# Patient Record
Sex: Female | Born: 1975 | Race: White | Hispanic: No | Marital: Single | Smoking: Current every day smoker
Health system: Southern US, Community
[De-identification: ages and names within clinical notes are randomized; demographics above are authoritative.]

## PROBLEM LIST (undated history)

## (undated) DIAGNOSIS — F32A Depression, unspecified: Secondary | ICD-10-CM

## (undated) DIAGNOSIS — M5030 Other cervical disc degeneration, unspecified cervical region: Secondary | ICD-10-CM

## (undated) DIAGNOSIS — F419 Anxiety disorder, unspecified: Secondary | ICD-10-CM

## (undated) HISTORY — PX: PLACEMENT OF BREAST IMPLANTS: SHX6334

## (undated) HISTORY — PX: ABDOMINAL HYSTERECTOMY: SHX81

---

## 1998-10-11 HISTORY — PX: AUGMENTATION MAMMAPLASTY: SUR837

## 2014-10-11 HISTORY — PX: BREAST BIOPSY: SHX20

## 2019-12-14 ENCOUNTER — Other Ambulatory Visit: Payer: Self-pay | Admitting: Obstetrics and Gynecology

## 2019-12-14 DIAGNOSIS — Z1231 Encounter for screening mammogram for malignant neoplasm of breast: Secondary | ICD-10-CM

## 2019-12-17 ENCOUNTER — Other Ambulatory Visit: Payer: Self-pay

## 2019-12-17 ENCOUNTER — Ambulatory Visit
Admission: RE | Admit: 2019-12-17 | Discharge: 2019-12-17 | Disposition: A | Discharge status: Home / Self Care | Payer: 59 | Source: Ambulatory Visit | Attending: Obstetrics and Gynecology | Admitting: Obstetrics and Gynecology

## 2019-12-17 ENCOUNTER — Other Ambulatory Visit: Payer: Self-pay | Admitting: Obstetrics and Gynecology

## 2019-12-17 DIAGNOSIS — Z1231 Encounter for screening mammogram for malignant neoplasm of breast: Secondary | ICD-10-CM | POA: Diagnosis not present

## 2019-12-17 IMAGING — MG DIGITAL SCREENING BREAST BILAT IMPLANT W/ TOMO W/ CAD
8 of 19 series · 8 of 40 positions shown · non-contrast
Comparison: Previous exam(s).

CLINICAL DATA: Screening.

EXAM:
DIGITAL SCREENING BILATERAL MAMMOGRAM WITH IMPLANTS, CAD AND TOMO
The patient has retropectoral implants. Standard and implant
displaced views were performed.

[L CC]
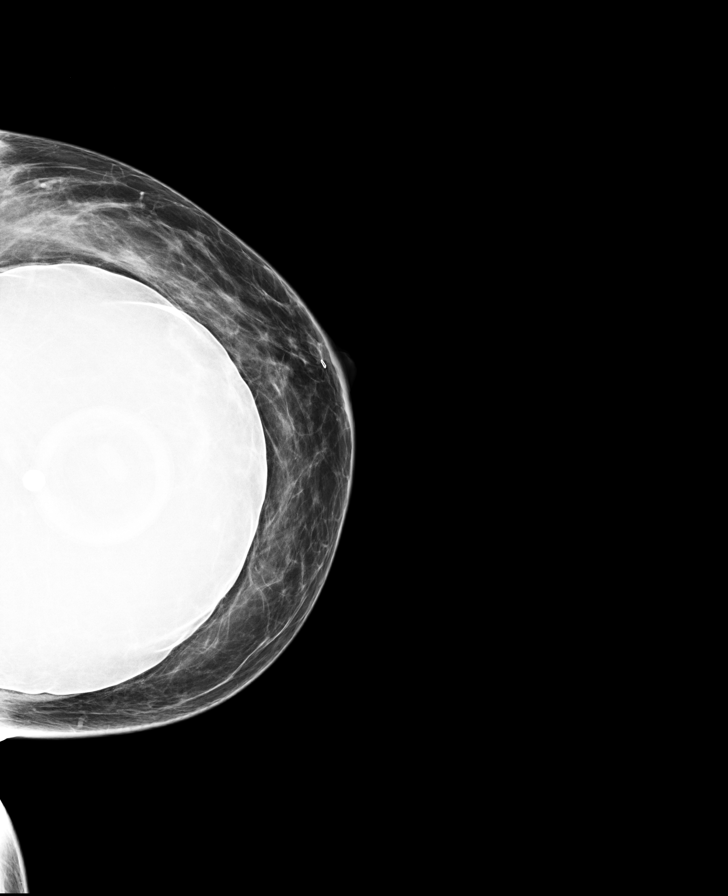

[R CC (1 of 2)]
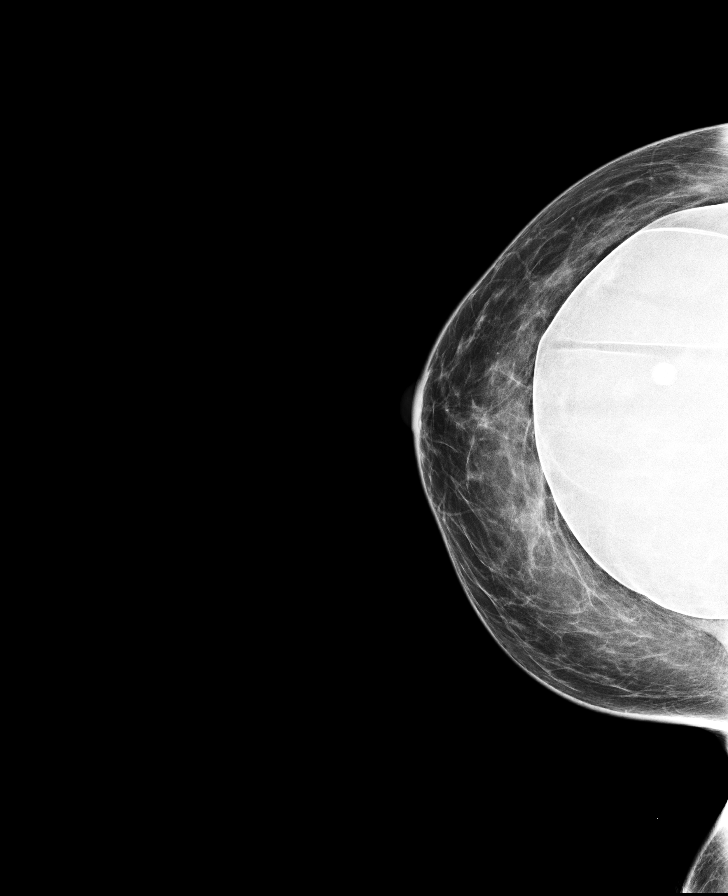

[R MLO (1 of 2)]
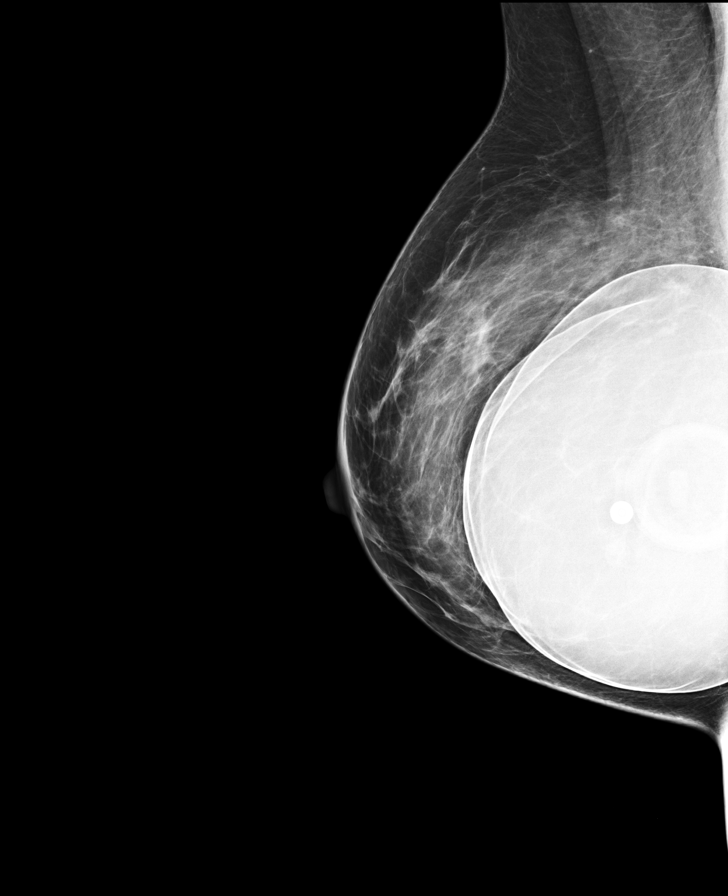

[L MLO]
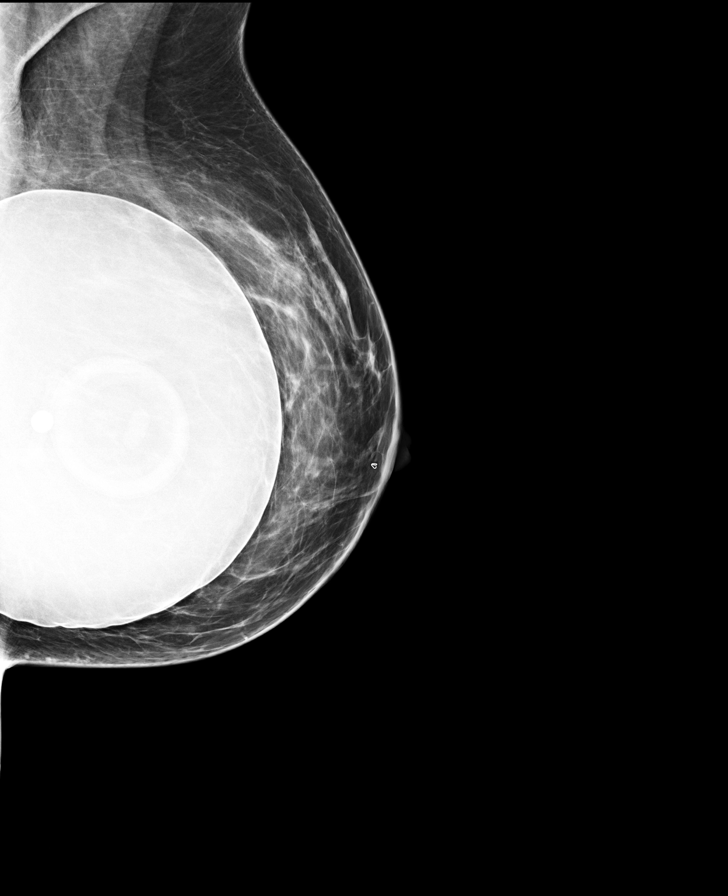

[L CC synth-2D]
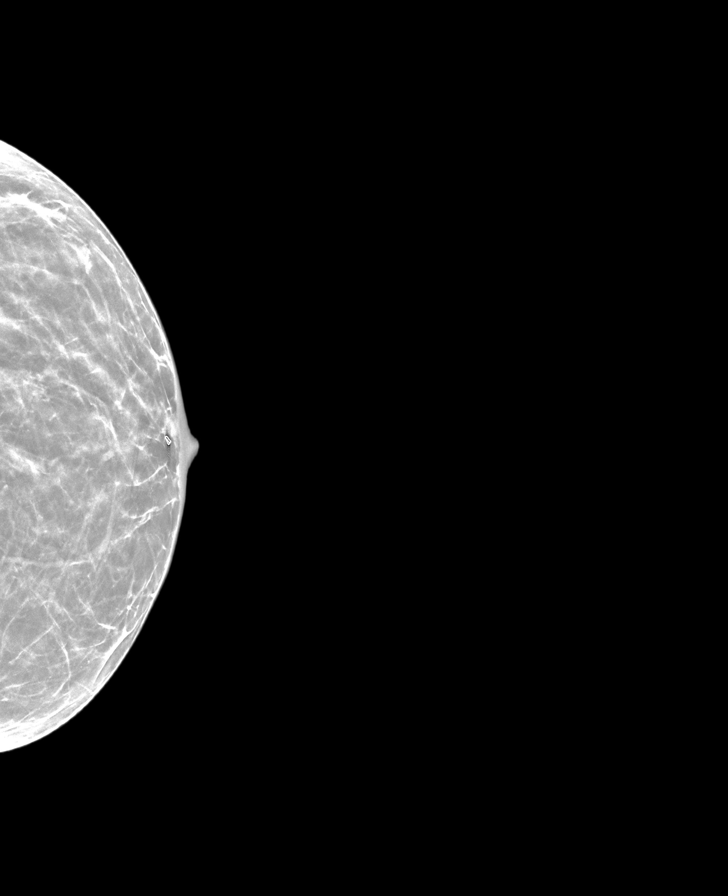

[R MLO (2 of 2)]
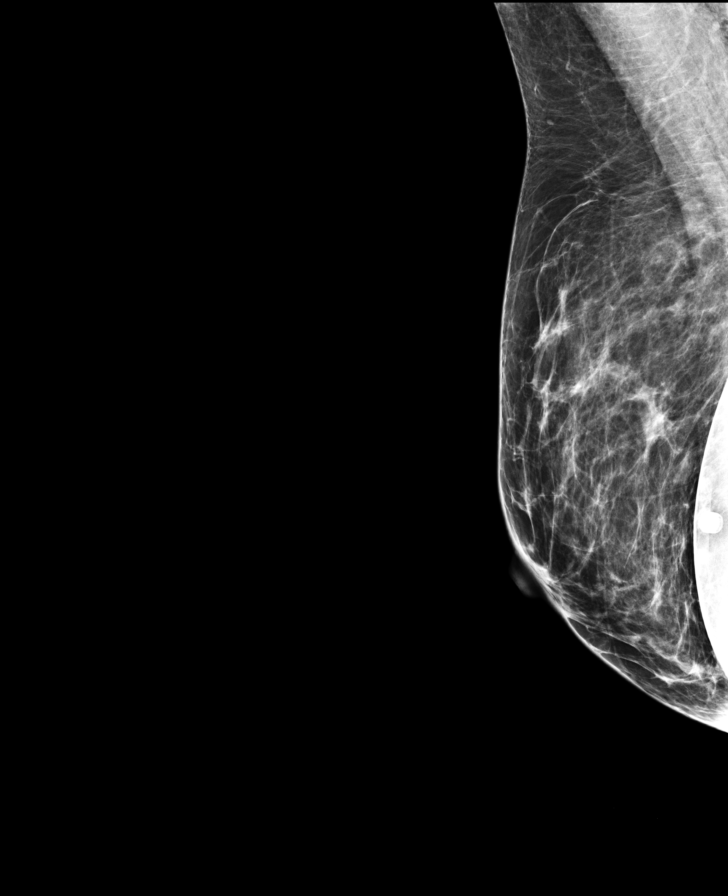

[R CC (2 of 2)]
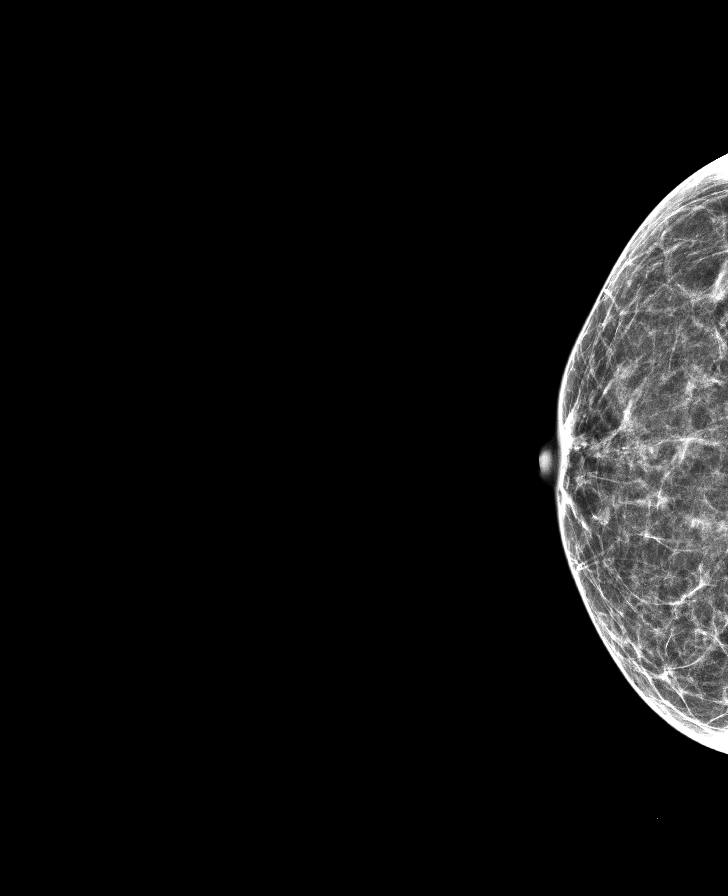

[L MLO synth-2D]
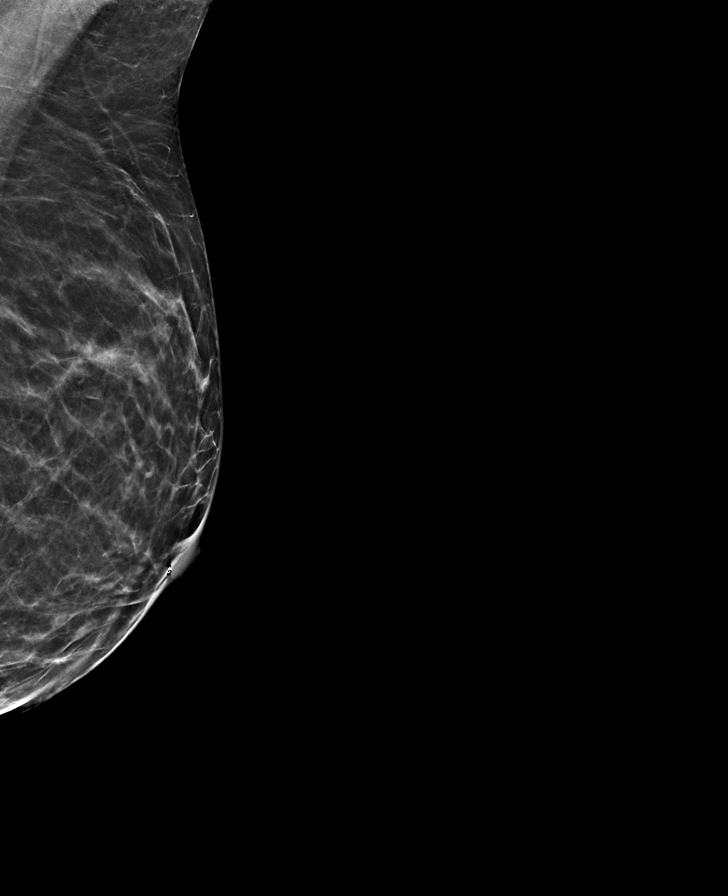

[8 of 40 positions shown; findings below may reference images not displayed]

ACR Breast Density Category b: There are scattered areas of
fibroglandular density.
FINDINGS: There are no findings suspicious for malignancy. Images were
processed with CAD.
IMPRESSION: No mammographic evidence of malignancy. A result letter of this
screening mammogram will be mailed directly to the patient.

RECOMMENDATION:
Screening mammogram in one year. (Code:[6A])

BI-RADS CATEGORY  1:  Negative.

## 2019-12-20 ENCOUNTER — Inpatient Hospital Stay
Admission: RE | Admit: 2019-12-20 | Discharge: 2019-12-20 | Disposition: A | Discharge status: Home / Self Care | Payer: Self-pay | Source: Ambulatory Visit | Attending: *Deleted | Admitting: *Deleted

## 2019-12-20 ENCOUNTER — Other Ambulatory Visit: Payer: Self-pay | Admitting: *Deleted

## 2019-12-20 DIAGNOSIS — Z1231 Encounter for screening mammogram for malignant neoplasm of breast: Secondary | ICD-10-CM

## 2020-05-16 ENCOUNTER — Other Ambulatory Visit: Payer: Self-pay | Admitting: Obstetrics and Gynecology

## 2020-05-16 NOTE — H&P (Signed)
Janet Warren is a 44 y.o. female here for [facility] and bilateral salpingectomy .   Marland Kitchenpt witha history of menorrhagia . Heavy 5 days at a time which is preventing her from social activities . May bleed more than once per month . LAb work c/w menopause . EMBX shows proliferative findings Intermittent dyspareunia( rare). Lysteda has helped some   Pap : neg   EMBX : proliferative  U/s :  Fibroid ut 1 rt lat= 17 mm 2 rt ant= 18 mm  Endometrium=9.05 mm  Lt ov wnl  Rt simple ov cyst= 1.41 cm Past Medical History:  has a past medical history of Abnormal Pap smear, Adjustment disorder with anxiety (06/30/2011), Allergic state, Anxiety, Candidiasis of vulva and vagina (05/28/2009), Cervical high risk human papillomavirus (HPV) DNA test positive (08/24/2011), Cigarette smoker, Depression, and Metrorrhagia (04/17/2009).  Past Surgical History:  has a past surgical history that includes Cesarean section (1994). Family History: family history includes Cataracts in her mother; Heart disease (age of onset: 52) in her father; High blood pressure (Hypertension) in her mother; High blood pressure (Hypertension) (age of onset: 26) in her father; Hyperlipidemia (Elevated cholesterol) (age of onset: 63) in her father. Social History:  reports that she has been smoking cigarettes. She has a 25.00 pack-year smoking history. She has never used smokeless tobacco. She reports current alcohol use of about 6.0 standard drinks of alcohol per week. She reports that she does not use drugs. OB/GYN History:          OB History    Gravida  1   Para  1   Term  1   Preterm  0   AB  0   Living  1     SAB  0   TAB  0   Ectopic  0   Molar      Multiple  0   Live Births  1          Allergies: has No Known Allergies. Medications:  Current Outpatient Medications:  .  buPROPion (WELLBUTRIN XL) 150 MG XL tablet, Take 1 tablet (150 mg total) by mouth once daily for 180 days, Disp: 90 tablet, Rfl:  3 .  cyanocobalamin (VITAMIN B12) 1000 MCG tablet, Take 1,000 mcg by mouth daily., Disp: , Rfl:  .  gabapentin (NEURONTIN) 600 MG tablet, Take 1 tablet (600 mg total) by mouth 3 (three) times daily, Disp: 270 tablet, Rfl: 3 .  MULTIVITAMIN ORAL, Take 1 tablet by mouth once daily.  , Disp: , Rfl:  .  tiZANidine (ZANAFLEX) 2 MG tablet, TAKE 1 TABLET (2 MG TOTAL) BY MOUTH NIGHTLY AS NEEDED, Disp: 30 tablet, Rfl: 1 .  tranexamic acid (LYSTEDA) 650 mg tablet, Take 2 tablets (1,300 mg total) by mouth 3 (three) times daily Take for a maximum of 5 days during monthly menstruation., Disp: 30 tablet, Rfl: 3 .  albuterol 90 mcg/actuation inhaler, Inhale 2 inhalations into the lungs every 6 (six) hours as needed for up to 30 days, Disp: 1 Inhaler, Rfl: 0 .  sertraline (ZOLOFT) 100 MG tablet, Take 1.5 tablets (150 mg total) by mouth once daily for 90 days (Patient not taking: Reported on 03/15/2019  ), Disp: 135 tablet, Rfl: 1 .  tranexamic acid (LYSTEDA) 650 mg tablet, Take 2 tablets (1,300 mg total) by mouth 3 (three) times daily Take for a maximum of 5 days during monthly menstruation. (Patient not taking: Reported on 05/15/2020  ), Disp: 90 tablet, Rfl: 0  Review of Systems:  General:                      No fatigue or weight loss Eyes:                           No vision changes Ears:                            No hearing difficulty Respiratory:                No cough or shortness of breath Pulmonary:                  No asthma or shortness of breath Cardiovascular:           No chest pain, palpitations, dyspnea on exertion Gastrointestinal:          No abdominal bloating, chronic diarrhea, constipations, masses, pain or hematochezia Genitourinary:             No hematuria, dysuria, abnormal vaginal discharge, pelvic pain, Menometrorrhagia Lymphatic:                   No swollen lymph nodes Musculoskeletal:         No muscle weakness Neurologic:                  No extremity weakness, syncope, seizure  disorder Psychiatric:                  No history of depression, delusions or suicidal/homicidal ideation    Exam:      Vitals:   05/15/20 1419  BP: 136/80  Pulse:     Body mass index is 26.29 kg/m.  WDWN white/  female in NAD   Lungs: CTA  CV : RRR without murmur    Neck:  no thyromegaly Abdomen: soft , no mass, normal active bowel sounds,  non-tender, no rebound tenderness Pelvic: tanner stage 5 ,  External genitalia: vulva /labia no lesions Urethra: no prolapse Vagina: normal physiologic d/c Cervix: no lesions, no cervical motion tenderness. Uterus:10 weeks slight irregularity Adnexa:no mass, non-tender   Impression:   The encounter diagnosis was Menorrhagia with irregular cycle.    Plan:   Benefits and risks to surgery: LSH and bilateral salpingectomy  The proposed benefit of the surgery has been discussed with the patient. The possible risks include, but are not limited to: organ injury to the bowel , bladder, ureters, and major blood vessels and nerves. There is a possibility of additional surgeries resulting from these injuries. There is also the risk of blood transfusion and the need to receive blood products during or after the procedure which may rarely lead to HIV or Hepatitis C infection. There is a risk of developing a deep venous thrombosis or a pulmonary embolism . There is the possibility of wound infection and also anesthetic complications, even the rare possibility of death. The patient understands these risks and wishes to proceed. All questions have been answered and the consent has been signed. She is aware of the risk of occult cancer and upstaging cancer. Risk between 1:770-1: 10,000    .  Caroline Sauger, MD

## 2020-05-20 ENCOUNTER — Other Ambulatory Visit: Payer: Self-pay

## 2020-05-20 ENCOUNTER — Encounter
Admission: RE | Admit: 2020-05-20 | Discharge: 2020-05-20 | Disposition: A | Discharge status: Home / Self Care | Payer: 59 | Source: Ambulatory Visit | Attending: Obstetrics and Gynecology | Admitting: Obstetrics and Gynecology

## 2020-05-20 HISTORY — DX: Anxiety disorder, unspecified: F41.9

## 2020-05-20 HISTORY — DX: Other cervical disc degeneration, unspecified cervical region: M50.30

## 2020-05-20 HISTORY — DX: Depression, unspecified: F32.A

## 2020-05-20 NOTE — Patient Instructions (Signed)
Your procedure is scheduled on: 05/22/20 Report to [place]. To find out your arrival time please call [phone] between 1PM - 3PM on 05/21/20.  Remember: Instructions that are not followed completely may result in serious medical risk, up to and including death, or upon the discretion of your surgeon and anesthesiologist your surgery may need to be rescheduled.     _X__ 1. Do not eat food after midnight the night before your procedure.                 No gum chewing or hard candies. You may drink clear liquids up to 2 hours                 before you are scheduled to arrive for your surgery- DO not drink clear                 liquids within 2 hours of the start of your surgery.                 Clear Liquids include:  water, apple juice without pulp, clear carbohydrate                 drink such as Clearfast or Gatorade, Black Coffee or Tea (Do not add                 anything to coffee or tea). Diabetics water only  __X__2.  On the morning of surgery brush your teeth with toothpaste and water, you                 may rinse your mouth with mouthwash if you wish.  Do not swallow any              toothpaste of mouthwash.     _X__ 3.  No Alcohol for 24 hours before or after surgery.   _X__ 4.  Do Not Smoke or use e-cigarettes For 24 Hours Prior to Your Surgery.                 Do not use any chewable tobacco products for at least 6 hours prior to                 surgery.  ____  5.  Bring all medications with you on the day of surgery if instructed.   __X__  6.  Notify your doctor if there is any change in your medical condition      (cold, fever, infections).     Do not wear jewelry, make-up, hairpins, clips or nail polish. Do not wear lotions, powders, or perfumes.  Do not shave 48 hours prior to surgery. Men may shave face and neck. Do not bring valuables to the hospital.    [facility] is not responsible for any belongings or  valuables.  Contacts, dentures/partials or body piercings may not be worn into surgery. Bring a case for your contacts, glasses or hearing aids, a denture cup will be supplied. Leave your suitcase in the car. After surgery it may be brought to your room. For patients admitted to the hospital, discharge time is determined by your treatment team.   Patients discharged the day of surgery will not be allowed to drive home.   Please read over the following fact sheets that you were given:   MRSA Information  __X__ Take these medicines the morning of surgery with A SIP OF WATER:  1. buPROPion (WELLBUTRIN XL) 150 MG 24 hr tablet  2. gabapentin (NEURONTIN) 600 MG tablet  3.   4.  5.  6.  ____ Fleet Enema (as directed)   __X__ Use CHG Soap/SAGE wipes as directed  ____ Use inhalers on the day of surgery  ____ Stop metformin/Janumet/Farxiga 2 days prior to surgery    ____ Take 1/2 of usual insulin dose the night before surgery. No insulin the morning          of surgery.   ____ Stop Blood Thinners Coumadin/Plavix/Xarelto/Pleta/Pradaxa/Eliquis/Effient/Aspirin  on   Or contact your Surgeon, Cardiologist or Medical Doctor regarding  ability to stop your blood thinners  __X__ Stop Anti-inflammatories 7 days before surgery such as Advil, Ibuprofen, Motrin,  BC or Goodies Powder, Naprosyn, Naproxen, Aleve, Aspirin    __X__ Stop all herbal supplements, fish oil or vitamin E until after surgery.    ____ Bring C-Pap to the hospital.     How to Use an Incentive Spirometer An incentive spirometer is a tool that measures how well you are filling your lungs with each breath. Learning to take long, deep breaths using this tool can help you keep your lungs clear and active. This may help to reverse or lessen your chance of developing breathing (pulmonary) problems, especially infection. You may be asked to use a spirometer:  After a surgery.  If you have a lung problem or a history of  smoking.  After a long period of time when you have been unable to move or be active. If the spirometer includes an indicator to show the highest number that you have reached, your health care provider or respiratory therapist will help you set a goal. Keep a list (log) of your progress as told by your health care provider. What are the risks?  Breathing too quickly may cause dizziness or cause you to pass out. Take your time so you do not get dizzy or light-headed.  If you are in pain, you may need to take pain medicine before doing incentive spirometry. It is harder to take a deep breath if you are having pain. How to use your incentive spirometer  1. Sit up on the edge of your bed or on a chair. 2. Hold the incentive spirometer so that it is in an upright position. 3. Before you use the spirometer, breathe out normally. 4. Place the mouthpiece in your mouth. Make sure your lips are closed tightly around it. 5. Breathe in slowly and as deeply as you can through your mouth, causing the piston or the ball to rise toward the top of the chamber. 6. Hold your breath for 3-5 seconds, or for as long as possible. ? If the spirometer includes a coach indicator, use this to guide you in breathing. Slow down your breathing if the indicator goes above the marked areas. 7. Remove the mouthpiece from your mouth and breathe out normally. The piston or ball will return to the bottom of the chamber. 8. Rest for a few seconds, then repeat the steps 10 or more times. ? Take your time and take a few normal breaths between deep breaths so that you do not get dizzy or light-headed. ? Do this every 1-2 hours when you are awake. 9. If the spirometer includes a goal marker to show the highest number you have reached (best effort), use this as a goal to work toward during each repetition. 10. After each set of 10 deep breaths, cough a few times.  This will help to make sure that your lungs are clear. ? If you have an  incision on your chest or abdomen from surgery, place a pillow or a rolled-up towel firmly against the incision when you cough. This can help to reduce pain from coughing. General tips  When you become able to get out of bed, walk around often and continue to cough to help clear your lungs.  Keep using the incentive spirometer until your health care provider says it is okay to stop using it. If you have been in the hospital, you may be told to keep using the spirometer at home. Contact a health care provider if:  You are having difficulty using the spirometer.  You have trouble using the spirometer as often as instructed.  Your pain medicine is not giving enough relief for you to use the spirometer as told.  You have a fever.  You develop shortness of breath. Get help right away if:  You develop a cough with bloody mucus from the lungs (bloody sputum).  You have fluid or blood coming from an incision site after you cough. Summary  An incentive spirometer is a tool that can help you learn to take long, deep breaths to keep your lungs clear and active.  You may be asked to use a spirometer after a surgery, if you have a lung problem or a history of smoking, or if you have been inactive for a long period of time.  Use your incentive spirometer as instructed every 1-2 hours while you are awake.  If you have an incision on your chest or abdomen, place a pillow or a rolled-up towel firmly against your incision when you cough. This will help to reduce pain. This information is not intended to replace advice given to you by your health care provider. Make sure you discuss any questions you have with your health care provider. Document Revised: 04/27/2019 Document Reviewed: 08/10/2017 Elsevier Patient Education  2020 [employer].

## 2020-05-21 ENCOUNTER — Other Ambulatory Visit
Admission: RE | Admit: 2020-05-21 | Discharge: 2020-05-21 | Disposition: A | Discharge status: Home / Self Care | Payer: 59 | Source: Ambulatory Visit | Attending: Obstetrics and Gynecology | Admitting: Obstetrics and Gynecology

## 2020-05-21 DIAGNOSIS — Z20822 Contact with and (suspected) exposure to covid-19: Secondary | ICD-10-CM | POA: Insufficient documentation

## 2020-05-21 DIAGNOSIS — Z01812 Encounter for preprocedural laboratory examination: Secondary | ICD-10-CM | POA: Insufficient documentation

## 2020-05-21 LAB — CBC
HCT: 44.3 % (ref 36.0–46.0)
Hemoglobin: 14.9 g/dL (ref 12.0–15.0)
MCH: 32.4 pg (ref 26.0–34.0)
MCHC: 33.6 g/dL (ref 30.0–36.0)
MCV: 96.3 fL (ref 80.0–100.0)
Platelets: 257 10*3/uL (ref 150–400)
RBC: 4.6 MIL/uL (ref 3.87–5.11)
RDW: 12.5 % (ref 11.5–15.5)
WBC: 5.8 10*3/uL (ref 4.0–10.5)
nRBC: 0 % (ref 0.0–0.2)

## 2020-05-21 LAB — BASIC METABOLIC PANEL
Anion gap: 8 (ref 5–15)
BUN: 8 mg/dL (ref 6–20)
CO2: 29 mmol/L (ref 22–32)
Calcium: 8.8 mg/dL — ABNORMAL LOW (ref 8.9–10.3)
Chloride: 104 mmol/L (ref 98–111)
Creatinine, Ser: 0.9 mg/dL (ref 0.44–1.00)
GFR calc Af Amer: >60 mL/min (ref >60)
GFR calc non Af Amer: >60 mL/min (ref >60)
Glucose, Bld: 63 mg/dL — ABNORMAL LOW (ref 70–99)
Potassium: 3.8 mmol/L (ref 3.5–5.1)
Sodium: 141 mmol/L (ref 135–145)

## 2020-05-21 LAB — TYPE AND SCREEN
ABO/RH(D): B POS
Antibody Screen: NEGATIVE

## 2020-05-21 LAB — SARS CORONAVIRUS 2 (TAT 6-24 HRS): SARS Coronavirus 2: NEGATIVE

## 2020-05-22 ENCOUNTER — Ambulatory Visit
Admission: RE | Admit: 2020-05-22 | Discharge: 2020-05-22 | Disposition: A | Discharge status: Home / Self Care | Payer: 59 | Attending: Obstetrics and Gynecology | Admitting: Obstetrics and Gynecology

## 2020-05-22 ENCOUNTER — Encounter
Admission: RE | Disposition: A | Discharge status: Home / Self Care | Payer: Self-pay | Source: Home / Self Care | Attending: Obstetrics and Gynecology

## 2020-05-22 ENCOUNTER — Ambulatory Visit: Payer: 59 | Admitting: Anesthesiology

## 2020-05-22 ENCOUNTER — Encounter: Payer: Self-pay | Admitting: Obstetrics and Gynecology

## 2020-05-22 DIAGNOSIS — Z79899 Other long term (current) drug therapy: Secondary | ICD-10-CM | POA: Diagnosis not present

## 2020-05-22 DIAGNOSIS — N83201 Unspecified ovarian cyst, right side: Secondary | ICD-10-CM | POA: Insufficient documentation

## 2020-05-22 DIAGNOSIS — N921 Excessive and frequent menstruation with irregular cycle: Secondary | ICD-10-CM | POA: Diagnosis present

## 2020-05-22 DIAGNOSIS — N803 Endometriosis of pelvic peritoneum: Secondary | ICD-10-CM | POA: Diagnosis not present

## 2020-05-22 DIAGNOSIS — N800 Endometriosis of uterus: Secondary | ICD-10-CM | POA: Insufficient documentation

## 2020-05-22 DIAGNOSIS — N838 Other noninflammatory disorders of ovary, fallopian tube and broad ligament: Secondary | ICD-10-CM | POA: Diagnosis not present

## 2020-05-22 DIAGNOSIS — D282 Benign neoplasm of uterine tubes and ligaments: Secondary | ICD-10-CM | POA: Diagnosis not present

## 2020-05-22 DIAGNOSIS — F4323 Adjustment disorder with mixed anxiety and depressed mood: Secondary | ICD-10-CM | POA: Diagnosis not present

## 2020-05-22 DIAGNOSIS — N809 Endometriosis, unspecified: Secondary | ICD-10-CM

## 2020-05-22 DIAGNOSIS — F17210 Nicotine dependence, cigarettes, uncomplicated: Secondary | ICD-10-CM | POA: Diagnosis not present

## 2020-05-22 HISTORY — PX: LAPAROSCOPIC SUPRACERVICAL HYSTERECTOMY: SHX5399

## 2020-05-22 HISTORY — PX: LAPAROSCOPIC BILATERAL SALPINGECTOMY: SHX5889

## 2020-05-22 LAB — POCT PREGNANCY, URINE: Preg Test, Ur: NEGATIVE

## 2020-05-22 LAB — ABO/RH: ABO/RH(D): B POS

## 2020-05-22 SURGERY — HYSTERECTOMY, SUPRACERVICAL, LAPAROSCOPIC
Anesthesia: General

## 2020-05-22 MED ORDER — OXYCODONE-ACETAMINOPHEN 5-325 MG PO TABS: 1.0000 | ORAL_TABLET | Freq: Four times a day (QID) | ORAL | Status: DC | PRN

## 2020-05-22 MED ORDER — LACTATED RINGERS IV SOLN: INTRAVENOUS | Status: DC

## 2020-05-22 MED ORDER — ACETAMINOPHEN 500 MG PO TABS
ORAL_TABLET | ORAL | Status: AC
  Administered 2020-05-22: 1000 mg via ORAL
  Filled 2020-05-22: qty 2

## 2020-05-22 MED ORDER — MIDAZOLAM HCL 2 MG/2ML IJ SOLN
INTRAMUSCULAR | Status: AC
  Filled 2020-05-22: qty 2

## 2020-05-22 MED ORDER — HYDROMORPHONE HCL 1 MG/ML IJ SOLN
INTRAMUSCULAR | Status: AC
  Filled 2020-05-22: qty 1

## 2020-05-22 MED ORDER — FAMOTIDINE 20 MG PO TABS
ORAL_TABLET | ORAL | Status: AC
  Filled 2020-05-22: qty 1

## 2020-05-22 MED ORDER — KETOROLAC TROMETHAMINE 30 MG/ML IJ SOLN: 30.0000 mg | Freq: Once | INTRAMUSCULAR | Status: DC | PRN

## 2020-05-22 MED ORDER — ACETAMINOPHEN 325 MG PO TABS: 325.0000 mg | ORAL_TABLET | ORAL | Status: DC | PRN

## 2020-05-22 MED ORDER — PHENYLEPHRINE HCL (PRESSORS) 10 MG/ML IV SOLN
INTRAVENOUS | Status: DC | PRN
  Administered 2020-05-22 (×2): 100 ug via INTRAVENOUS

## 2020-05-22 MED ORDER — GABAPENTIN 300 MG PO CAPS
ORAL_CAPSULE | ORAL | Status: AC
  Administered 2020-05-22: 300 mg via ORAL
  Filled 2020-05-22: qty 1

## 2020-05-22 MED ORDER — EPHEDRINE SULFATE 50 MG/ML IJ SOLN
INTRAMUSCULAR | Status: DC | PRN
  Administered 2020-05-22: 5 mg via INTRAVENOUS

## 2020-05-22 MED ORDER — PROPOFOL 10 MG/ML IV BOLUS
INTRAVENOUS | Status: AC
  Filled 2020-05-22: qty 20

## 2020-05-22 MED ORDER — FENTANYL CITRATE (PF) 100 MCG/2ML IJ SOLN
INTRAMUSCULAR | Status: AC
  Filled 2020-05-22: qty 2

## 2020-05-22 MED ORDER — LABETALOL HCL 5 MG/ML IV SOLN
INTRAVENOUS | Status: AC
  Filled 2020-05-22: qty 4

## 2020-05-22 MED ORDER — ORAL CARE MOUTH RINSE: 15.0000 mL | Freq: Once | OROMUCOSAL | Status: AC

## 2020-05-22 MED ORDER — BUPIVACAINE HCL 0.5 % IJ SOLN
INTRAMUSCULAR | Status: DC | PRN
  Administered 2020-05-22: 12 mL

## 2020-05-22 MED ORDER — LIDOCAINE HCL (CARDIAC) PF 100 MG/5ML IV SOSY
PREFILLED_SYRINGE | INTRAVENOUS | Status: DC | PRN
  Administered 2020-05-22: 100 mg via INTRAVENOUS

## 2020-05-22 MED ORDER — DEXAMETHASONE SODIUM PHOSPHATE 10 MG/ML IJ SOLN
INTRAMUSCULAR | Status: DC | PRN
  Administered 2020-05-22: 10 mg via INTRAVENOUS

## 2020-05-22 MED ORDER — PROMETHAZINE HCL 25 MG/ML IJ SOLN: 6.2500 mg | INTRAMUSCULAR | Status: DC | PRN

## 2020-05-22 MED ORDER — ONDANSETRON HCL 4 MG/2ML IJ SOLN
INTRAMUSCULAR | Status: DC | PRN
  Administered 2020-05-22: 4 mg via INTRAVENOUS

## 2020-05-22 MED ORDER — FENTANYL CITRATE (PF) 100 MCG/2ML IJ SOLN: 25.0000 ug | INTRAMUSCULAR | Status: DC | PRN

## 2020-05-22 MED ORDER — CHLORHEXIDINE GLUCONATE 0.12 % MT SOLN: 15.0000 mL | Freq: Once | OROMUCOSAL | Status: AC

## 2020-05-22 MED ORDER — ONDANSETRON HCL 4 MG PO TABS: 8.0000 mg | ORAL_TABLET | Freq: Three times a day (TID) | ORAL | Status: DC | PRN

## 2020-05-22 MED ORDER — ROCURONIUM BROMIDE 100 MG/10ML IV SOLN
INTRAVENOUS | Status: DC | PRN
  Administered 2020-05-22: 50 mg via INTRAVENOUS
  Administered 2020-05-22: 10 mg via INTRAVENOUS

## 2020-05-22 MED ORDER — CEFAZOLIN SODIUM-DEXTROSE 2-4 GM/100ML-% IV SOLN
INTRAVENOUS | Status: AC
  Filled 2020-05-22: qty 100

## 2020-05-22 MED ORDER — MEPERIDINE HCL 50 MG/ML IJ SOLN: 6.2500 mg | INTRAMUSCULAR | Status: DC | PRN

## 2020-05-22 MED ORDER — PROMETHAZINE HCL 25 MG/ML IJ SOLN
INTRAMUSCULAR | Status: AC
  Administered 2020-05-22: 6.25 mg via INTRAVENOUS
  Filled 2020-05-22: qty 1

## 2020-05-22 MED ORDER — LABETALOL HCL 5 MG/ML IV SOLN
INTRAVENOUS | Status: DC | PRN
Start: 2020-05-22 — End: 2020-05-22
  Administered 2020-05-22: 5 mg via INTRAVENOUS

## 2020-05-22 MED ORDER — CHLORHEXIDINE GLUCONATE 0.12 % MT SOLN
OROMUCOSAL | Status: AC
  Administered 2020-05-22: 15 mL via OROMUCOSAL
  Filled 2020-05-22: qty 15

## 2020-05-22 MED ORDER — CEFAZOLIN SODIUM-DEXTROSE 2-4 GM/100ML-% IV SOLN
2.0000 g | Freq: Once | INTRAVENOUS | Status: AC
  Administered 2020-05-22: 2 g via INTRAVENOUS

## 2020-05-22 MED ORDER — BUPIVACAINE HCL (PF) 0.5 % IJ SOLN
INTRAMUSCULAR | Status: AC
  Filled 2020-05-22: qty 30

## 2020-05-22 MED ORDER — POVIDONE-IODINE 10 % EX SWAB
2.0000 "application " | Freq: Once | CUTANEOUS | Status: AC
  Administered 2020-05-22: 2 via TOPICAL

## 2020-05-22 MED ORDER — FAMOTIDINE 20 MG PO TABS
20.0000 mg | ORAL_TABLET | Freq: Once | ORAL | Status: AC
  Administered 2020-05-22: 20 mg via ORAL

## 2020-05-22 MED ORDER — HYDROCODONE-ACETAMINOPHEN 7.5-325 MG PO TABS: 1.0000 | ORAL_TABLET | Freq: Once | ORAL | Status: DC | PRN

## 2020-05-22 MED ORDER — EPHEDRINE 5 MG/ML INJ
INTRAVENOUS | Status: AC
  Filled 2020-05-22: qty 10

## 2020-05-22 MED ORDER — SUGAMMADEX SODIUM 200 MG/2ML IV SOLN
INTRAVENOUS | Status: DC | PRN
  Administered 2020-05-22: 200 mg via INTRAVENOUS

## 2020-05-22 MED ORDER — DROPERIDOL 2.5 MG/ML IJ SOLN
0.6250 mg | Freq: Once | INTRAMUSCULAR | Status: DC | PRN
  Filled 2020-05-22: qty 2

## 2020-05-22 MED ORDER — ACETAMINOPHEN 160 MG/5ML PO SOLN
325.0000 mg | ORAL | Status: DC | PRN
  Filled 2020-05-22: qty 20.3

## 2020-05-22 MED ORDER — PROPOFOL 10 MG/ML IV BOLUS
INTRAVENOUS | Status: DC | PRN
  Administered 2020-05-22: 200 mg via INTRAVENOUS

## 2020-05-22 MED ORDER — KETOROLAC TROMETHAMINE 30 MG/ML IJ SOLN
INTRAMUSCULAR | Status: DC | PRN
  Administered 2020-05-22: 30 mg via INTRAVENOUS

## 2020-05-22 MED ORDER — FENTANYL CITRATE (PF) 100 MCG/2ML IJ SOLN
INTRAMUSCULAR | Status: DC | PRN
  Administered 2020-05-22: 100 ug via INTRAVENOUS
  Administered 2020-05-22: 50 ug via INTRAVENOUS
  Administered 2020-05-22 (×2): 100 ug via INTRAVENOUS

## 2020-05-22 MED ORDER — HYDROMORPHONE HCL 1 MG/ML IJ SOLN
INTRAMUSCULAR | Status: DC | PRN
  Administered 2020-05-22 (×2): .5 mg via INTRAVENOUS

## 2020-05-22 MED ORDER — GABAPENTIN 300 MG PO CAPS: 300.0000 mg | ORAL_CAPSULE | ORAL | Status: AC

## 2020-05-22 MED ORDER — ACETAMINOPHEN 500 MG PO TABS: 1000.0000 mg | ORAL_TABLET | ORAL | Status: AC

## 2020-05-22 MED ORDER — FENTANYL CITRATE (PF) 250 MCG/5ML IJ SOLN
INTRAMUSCULAR | Status: AC
  Filled 2020-05-22: qty 5

## 2020-05-22 MED ORDER — MIDAZOLAM HCL 2 MG/2ML IJ SOLN
INTRAMUSCULAR | Status: DC | PRN
  Administered 2020-05-22: 2 mg via INTRAVENOUS

## 2020-05-22 SURGICAL SUPPLY — 53 items
APPLICATOR COTTON TIP 6 STRL (MISCELLANEOUS) ×2 IMPLANT
APPLICATOR COTTON TIP 6IN STRL (MISCELLANEOUS) ×4
BAG URINE DRAIN 2000ML AR STRL (UROLOGICAL SUPPLIES) ×4 IMPLANT
BLADE SURG SZ11 CARB STEEL (BLADE) ×4 IMPLANT
CANISTER SUCT 1200ML W/VALVE (MISCELLANEOUS) ×4 IMPLANT
CATH FOLEY 2WAY  5CC 16FR (CATHETERS) ×2
CATH ROBINSON RED A/P 16FR (CATHETERS) IMPLANT
CATH URTH 16FR FL 2W BLN LF (CATHETERS) ×2 IMPLANT
CHLORAPREP W/TINT 26 (MISCELLANEOUS) ×4 IMPLANT
CLOSURE WOUND 1/2 X4 (GAUZE/BANDAGES/DRESSINGS) ×1
CLOSURE WOUND 1/4X4 (GAUZE/BANDAGES/DRESSINGS) ×1
COVER WAND RF STERILE (DRAPES) ×4 IMPLANT
DRSG TEGADERM 2-3/8X2-3/4 SM (GAUZE/BANDAGES/DRESSINGS) ×12 IMPLANT
GLOVE SURG SYN 8.0 (GLOVE) ×4 IMPLANT
GOWN STRL REUS W/ TWL LRG LVL3 (GOWN DISPOSABLE) ×4 IMPLANT
GOWN STRL REUS W/ TWL XL LVL3 (GOWN DISPOSABLE) ×2 IMPLANT
GOWN STRL REUS W/TWL LRG LVL3 (GOWN DISPOSABLE) ×4
GOWN STRL REUS W/TWL XL LVL3 (GOWN DISPOSABLE) ×2
GRASPER SUT TROCAR 14GX15 (MISCELLANEOUS) ×4 IMPLANT
IRRIGATION STRYKERFLOW (MISCELLANEOUS) ×2 IMPLANT
IRRIGATOR STRYKERFLOW (MISCELLANEOUS) ×4
IV LACTATED RINGERS 1000ML (IV SOLUTION) IMPLANT
IV NS 1000ML (IV SOLUTION) ×2
IV NS 1000ML BAXH (IV SOLUTION) ×2 IMPLANT
KIT PINK PAD W/HEAD ARE REST (MISCELLANEOUS) ×4
KIT PINK PAD W/HEAD ARM REST (MISCELLANEOUS) ×2 IMPLANT
KIT TURNOVER CYSTO (KITS) ×4 IMPLANT
LABEL OR SOLS (LABEL) ×4 IMPLANT
MORCELLATOR XCISE  COR (MISCELLANEOUS) ×2
MORCELLATOR XCISE COR (MISCELLANEOUS) ×2 IMPLANT
NS IRRIG 500ML POUR BTL (IV SOLUTION) ×4 IMPLANT
PACK GYN LAPAROSCOPIC (MISCELLANEOUS) ×4 IMPLANT
PAD OB MATERNITY 4.3X12.25 (PERSONAL CARE ITEMS) ×4 IMPLANT
PAD PREP 24X41 OB/GYN DISP (PERSONAL CARE ITEMS) ×4 IMPLANT
POUCH SPECIMEN RETRIEVAL 10MM (ENDOMECHANICALS) IMPLANT
SET CYSTO W/LG BORE CLAMP LF (SET/KITS/TRAYS/PACK) IMPLANT
SET TUBE SMOKE EVAC HIGH FLOW (TUBING) ×4 IMPLANT
SHEARS HARMONIC ACE PLUS 36CM (ENDOMECHANICALS) ×4 IMPLANT
SLEEVE ENDOPATH XCEL 5M (ENDOMECHANICALS) ×4 IMPLANT
SOLUTION ELECTROLUBE (MISCELLANEOUS) ×4 IMPLANT
SPONGE GAUZE 2X2 8PLY STER LF (GAUZE/BANDAGES/DRESSINGS) ×2
SPONGE GAUZE 2X2 8PLY STRL LF (GAUZE/BANDAGES/DRESSINGS) ×6 IMPLANT
STRIP CLOSURE SKIN 1/2X4 (GAUZE/BANDAGES/DRESSINGS) ×3 IMPLANT
STRIP CLOSURE SKIN 1/4X4 (GAUZE/BANDAGES/DRESSINGS) ×3 IMPLANT
SUT VIC AB 0 CT1 36 (SUTURE) ×4 IMPLANT
SUT VIC AB 2-0 UR6 27 (SUTURE) ×4 IMPLANT
SUT VIC AB 4-0 SH 27 (SUTURE) ×4
SUT VIC AB 4-0 SH 27XANBCTRL (SUTURE) ×4 IMPLANT
SWABSTK COMLB BENZOIN TINCTURE (MISCELLANEOUS) ×4 IMPLANT
SYR 10ML LL (SYRINGE) ×4 IMPLANT
SYR 20ML LL LF (SYRINGE) ×4 IMPLANT
TROCAR ENDO BLADELESS 11MM (ENDOMECHANICALS) ×4 IMPLANT
TROCAR XCEL NON-BLD 5MMX100MML (ENDOMECHANICALS) ×4 IMPLANT

## 2020-05-22 NOTE — Progress Notes (Signed)
Pt here for [facility] and bilateral salpingectomy .  Labs reviewed . NPO . All questions answered . Ready to proceed .negative HCG

## 2020-05-22 NOTE — Op Note (Signed)
NAME: Janet Warren, Janet Warren MEDICAL RECORD [id number] ACCOUNT [id number] DATE OF BIRTH:07-13-1976 FACILITY: [facility] LOCATION: [facility] PHYSICIAN:Hughie Melroy Josefine Class, MD  OPERATIVE REPORT  DATE OF PROCEDURE:  05/22/2020  PREOPERATIVE DIAGNOSIS:   Menometrorrhagia.  POSTOPERATIVE DIAGNOSES: 1.  Menometrorrhagia. 2.  Anterior cul-de-sac endometriosis. 3.  Benign right ovarian cyst.  PROCEDURE: 1.  Laparoscopic supracervical hysterectomy. 2.  Bilateral salpingectomy. 3.  Right ovarian cystotomy. 4.  Excision of peritoneal endometriosis.  SURGEON:  Laverta Baltimore, MD  FIRST ASSISTANT:  Benjaman Kindler, MD   SECOND ASSISTANT:  PA student, Yehuda Savannah  ANESTHESIA:  General endotracheal anesthesia.  INDICATIONS:  This is a 44 year old gravida 1, para 1, prior cesarean section with a long history of menometrorrhagia with bleeding for 5 days, heavy and has more than 1 bleeding episode per month.  FINDINGS:  Slightly bulbous uterus and scarring in the anterior cul-de-sac consistent with endometriosis.  A benign-appearing right ovarian cyst.  DESCRIPTION OF PROCEDURE:  After adequate general endotracheal anesthesia, the patient was placed in the supine position with the legs in the Dixon stirrups.  The patient's abdomen, perineum and vagina were prepped and draped in normal sterile fashion.   Timeout was performed.  The patient did receive 2 grams IV Ancef prior to commencement of the case.  A Foley catheter was placed into the bladder and a speculum was placed in the vagina and the anterior cervix was grasped with a single tooth tenaculum  and a uterine sound was placed into the endometrial cavity.  The tenaculum and the sound were then tethered together with Steri-Strips.  Gloves were changed.  Mickey Farber was changed.  Attention was directed to the patient's abdomen where a 5 mm infraumbilical  incision was made after injecting with 0.5% Marcaine.  The laparoscope was advanced into the  abdominal cavity under direct visualization with the Optiview cannula.  Upon entry into the abdominal cavity the patient's abdomen was insufflated with carbon  dioxide.  Second port site was placed in the left lower quadrant 3 cm medial to the left anterior iliac spine.  A #11 trocar was advanced under direct visualization.  A 3rd port was placed in the right lower quadrant 3 cm medial to the right anterior  iliac spine and a 5 mm trocar was advanced under direct visualization.  Omental strand of scarring to the anterior abdominal wall was then taken down with Harmonic scalpel.  Initial impression revealed a right ovarian cyst approximately 3 x 3 cm, a  slightly bulbous uterus, and some scarring of the anterior cul-de-sac consistent with endometriosis.  Bilateral ureters were identified with normal peristaltic activity.  Attention was then directed to the left fallopian tube, which was grasped with the  distal portion and the mesosalpinx was dissected to the proximal portion of fallopian tube.  The uteroovarian ligament was then cauterized and transected.  The broad ligament on the left was opened and the left uterine artery was skeletonized, cauterized  and transected.  Bladder flap was created to reflect the bladder inferiorly.  The cervix was then partially transected at the level of the uterosacral ligaments.  A similar procedure was repeated on the patient's right side.  Again, the right fallopian  tube was grasped with the distal portion of fallopian tube and the right mesosalpinx was dissected and followed by opening the broad ligament, transecting the uteroovarian ligament and the right uterine artery was skeletonized, cauterized and transected.   The cervix was then connected to the previously dissected area and the uterus was dissected free  from the cervical stump.  The uterine sound was then removed and aided by a vaginal hand the cervical stump endocervical canal was then cauterized with the   Kleppinger cautery.  Gloves were changed.  Mickey Farber was changed and the morcellator was brought up and placed through the left lower port sites under direct visualization.  The uterus was morcellated in standard fashion and the morcellator was then removed.   The patient's abdomen was irrigated and good hemostasis was noted.  Pressure was lowered to 7 mmHg and good hemostasis was noted.  Ureters were identified bilaterally with normal peristaltic activity.  The anterior cul-de-sac area in question was  picked up with [place] graspers and peritoneal excision was performed.  This tissue will be sent to pathology for identification.  The right ovarian cyst was opened with the Harmonic scalpel and draining colored fluid.  Upper abdomen appeared normal.   The left lower port site was closed with a fascial layer using the Carter-Thomason PMI apparatus.  Good closure of the fascia.  The patient's abdomen was then deflated, and all port sites were removed and the skin was reapproximated with interrupted 4-0  Vicryl suture.  Sterile dressings applied.  COMPLICATIONS:  There were no complications.  ESTIMATED BLOOD LOSS:  25 mL.  INTRAOPERATIVE FLUIDS:  600 mL.  URINE OUTPUT:  150 mL.  The patient was taken to recovery room in good condition.  CN/NUANCE  D:05/22/2020 T:05/22/2020 JOB:[id number]

## 2020-05-22 NOTE — Discharge Instructions (Signed)

## 2020-05-22 NOTE — Anesthesia Preprocedure Evaluation (Addendum)
Anesthesia Evaluation  Patient identified by MRN, date of birth, ID band Patient awake    Reviewed: Allergy & Precautions, H&P , NPO status , reviewed documented beta blocker date and time   Airway Mallampati: I  TM Distance: >3 FB Neck ROM: full    Dental  (+) Teeth Intact   Pulmonary Current Smoker and Patient abstained from smoking.,    Pulmonary exam normal        Cardiovascular Normal cardiovascular exam     Neuro/Psych PSYCHIATRIC DISORDERS Anxiety Depression    GI/Hepatic neg GERD  ,  Endo/Other    Renal/GU      Musculoskeletal  (+) Arthritis ,   Abdominal   Peds  Hematology   Anesthesia Other Findings Past Medical History: No date: Anxiety No date: DDD (degenerative disc disease), cervical No date: Depression Past Surgical History: No date: CESAREAN SECTION No date: PLACEMENT OF BREAST IMPLANTS   Reproductive/Obstetrics                            Anesthesia Physical Anesthesia Plan  ASA: II  Anesthesia Plan: General   Post-op Pain Management:    Induction: Intravenous  PONV Risk Score and Plan: 4 or greater and Ondansetron, Treatment may vary due to age or medical condition, Dexamethasone, Midazolam and Diphenhydramine  Airway Management Planned: Oral ETT  Additional Equipment:   Intra-op Plan:   Post-operative Plan: Extubation in OR  Informed Consent: I have reviewed the patients History and Physical, chart, labs and discussed the procedure including the risks, benefits and alternatives for the proposed anesthesia with the patient or authorized representative who has indicated his/her understanding and acceptance.     Dental Advisory Given  Plan Discussed with: CRNA  Anesthesia Plan Comments:        Anesthesia Quick Evaluation

## 2020-05-22 NOTE — Anesthesia Procedure Notes (Signed)
Procedure Name: Intubation Date/Time: 05/22/2020 10:08 AM Performed by: Genevie Ann, CRNA Pre-anesthesia Checklist: Patient identified, Emergency Drugs available, Suction available, Patient being monitored and Timeout performed Patient Re-evaluated:Patient Re-evaluated prior to induction Oxygen Delivery Method: Circle system utilized Preoxygenation: Pre-oxygenation with 100% oxygen Induction Type: IV induction Ventilation: Mask ventilation without difficulty Laryngoscope Size: McGraph and 3 Grade View: Grade I Tube size: 7.0 mm Number of attempts: 1 Airway Equipment and Method: Stylet Placement Confirmation: ETT inserted through vocal cords under direct vision,  positive ETCO2 and breath sounds checked- equal and bilateral Secured at: 21 cm Tube secured with: Tape Dental Injury: Teeth and Oropharynx as per pre-operative assessment

## 2020-05-22 NOTE — Transfer of Care (Signed)
Immediate Anesthesia Transfer of Care Note  Patient: SYMPHANIE CEDERBERG  Procedure(s) Performed: LAPAROSCOPIC SUPRACERVICAL HYSTERECTOMY (N/A ) LAPAROSCOPIC BILATERAL SALPINGECTOMY (Bilateral )  Patient Location: PACU  Anesthesia Type:General  Level of Consciousness: awake, alert  and sedated  Airway & Oxygen Therapy: Patient Spontanous Breathing and Patient connected to nasal cannula oxygen  Post-op Assessment: Report given to RN and Post -op Vital signs reviewed and stable  Post vital signs: Reviewed and stable  Last Vitals:  Vitals Value Taken Time  BP 123/89 05/22/20 1223  Temp 36.2 C 05/22/20 1223  Pulse 67 05/22/20 1228  Resp 9 05/22/20 1228  SpO2 89 % 05/22/20 1228  Vitals shown include unvalidated device data.  Last Pain:  Vitals:   05/22/20 1223  TempSrc:   PainSc: Asleep         Complications: No complications documented.

## 2020-05-22 NOTE — Anesthesia Postprocedure Evaluation (Signed)
Anesthesia Post Note  Patient: Janet Warren  Procedure(s) Performed: LAPAROSCOPIC SUPRACERVICAL HYSTERECTOMY (N/A ) LAPAROSCOPIC BILATERAL SALPINGECTOMY (Bilateral )  Patient location during evaluation: PACU Anesthesia Type: General Level of consciousness: awake and alert Pain management: pain level controlled Vital Signs Assessment: post-procedure vital signs reviewed and stable Respiratory status: spontaneous breathing, nonlabored ventilation and respiratory function stable Cardiovascular status: blood pressure returned to baseline and stable Postop Assessment: no apparent nausea or vomiting Anesthetic complications: no   No complications documented.   Last Vitals:  Vitals:   05/22/20 1344 05/22/20 1425  BP: (!) 146/89 116/79  Pulse: 64 68  Resp: 16 18  Temp: (!) 36.4 C 36.5 C  SpO2: 99% 96%    Last Pain:  Vitals:   05/22/20 1425  TempSrc:   PainSc: 2                  Alphonsus Sias

## 2020-05-22 NOTE — Brief Op Note (Signed)
05/22/2020  12:08 PM  PATIENT:  Janet Warren  45 y.o. female  PRE-OPERATIVE DIAGNOSIS:  Menorrhagia  POST-OPERATIVE DIAGNOSIS:  Menorrhagia Endometriotic implant anterior cul de sac  Right ovarian cyst PROCEDURE:  Procedure(s): LAPAROSCOPIC SUPRACERVICAL HYSTERECTOMY (N/A) LAPAROSCOPIC BILATERAL SALPINGECTOMY (Bilateral) Excision of endometiotic implant  Right ovarian cystostomy  SURGEON:  Surgeon(s) and Role:    * Britta Louth, Gwen Her, MD - Primary    * Benjaman Kindler, MD - Assisting  PHYSICIAN ASSISTANT: PA student Yehuda Savannah  ASSISTANTS: none   ANESTHESIA:   general  EBL:  25 mL IOF 600 cc, OU 150 cc  BLOOD ADMINISTERED:none  DRAINS: none   LOCAL MEDICATIONS USED:  MARCAINE     SPECIMEN:  Source of Specimen:  1. uterus and fallopian tubes 2. endometriotic implant   DISPOSITION OF SPECIMEN:  PATHOLOGY  COUNTS:  YES  TOURNIQUET:  * No tourniquets in log *  DICTATION: .Other Dictation: Dictation Number verbal  PLAN OF CARE: Discharge to home after PACU  PATIENT DISPOSITION:  PACU - hemodynamically stable.   Delay start of Pharmacological VTE agent (>24hrs) due to surgical blood loss or risk of bleeding: not applicable

## 2020-05-23 ENCOUNTER — Encounter: Payer: Self-pay | Admitting: Obstetrics and Gynecology

## 2020-05-26 LAB — SURGICAL PATHOLOGY

## 2020-12-31 ENCOUNTER — Other Ambulatory Visit: Payer: Self-pay | Admitting: Obstetrics and Gynecology

## 2020-12-31 DIAGNOSIS — Z1231 Encounter for screening mammogram for malignant neoplasm of breast: Secondary | ICD-10-CM

## 2021-01-07 ENCOUNTER — Ambulatory Visit
Admission: RE | Admit: 2021-01-07 | Discharge: 2021-01-07 | Disposition: A | Discharge status: Home / Self Care | Payer: 59 | Source: Ambulatory Visit | Attending: Obstetrics and Gynecology | Admitting: Obstetrics and Gynecology

## 2021-01-07 ENCOUNTER — Other Ambulatory Visit: Payer: Self-pay

## 2021-01-07 DIAGNOSIS — Z1231 Encounter for screening mammogram for malignant neoplasm of breast: Secondary | ICD-10-CM | POA: Insufficient documentation

## 2021-01-07 IMAGING — MG DIGITAL SCREENING BREAST BILAT IMPLANT W/ TOMO W/ CAD
9 of 19 series · 9 of 39 positions shown · non-contrast
Comparison: Previous exam(s).

CLINICAL DATA: Screening.

EXAM:
DIGITAL SCREENING BILATERAL MAMMOGRAM WITH IMPLANTS, CAD AND
TOMOSYNTHESIS
TECHNIQUE: Bilateral screening digital craniocaudal and mediolateral oblique
mammograms were obtained. Bilateral screening digital breast
tomosynthesis was performed. The images were evaluated with
computer-aided detection. Standard and/or implant displaced views
were performed.

[R MLO (1 of 2)]
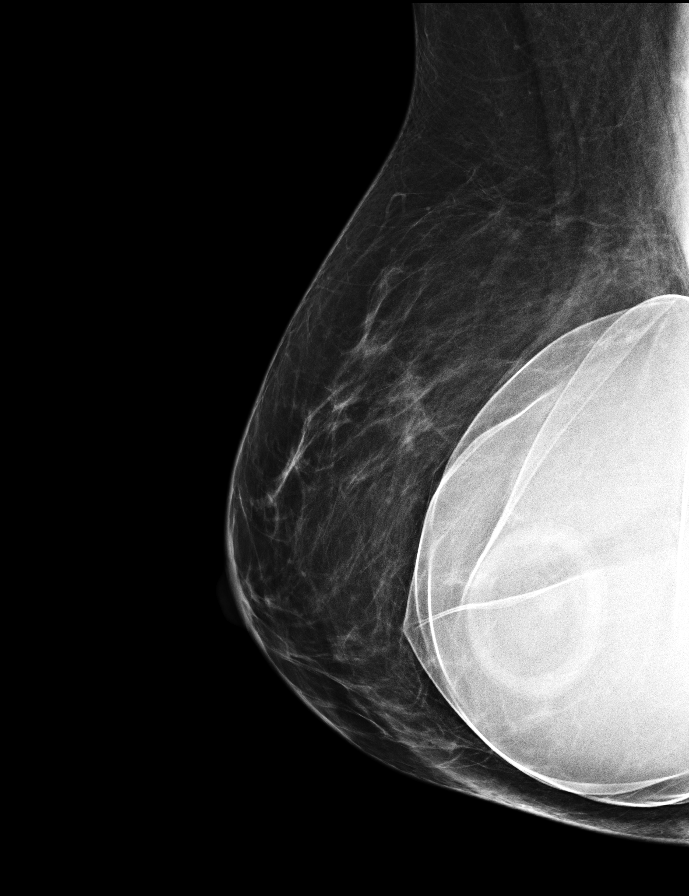

[L CC (1 of 2)]
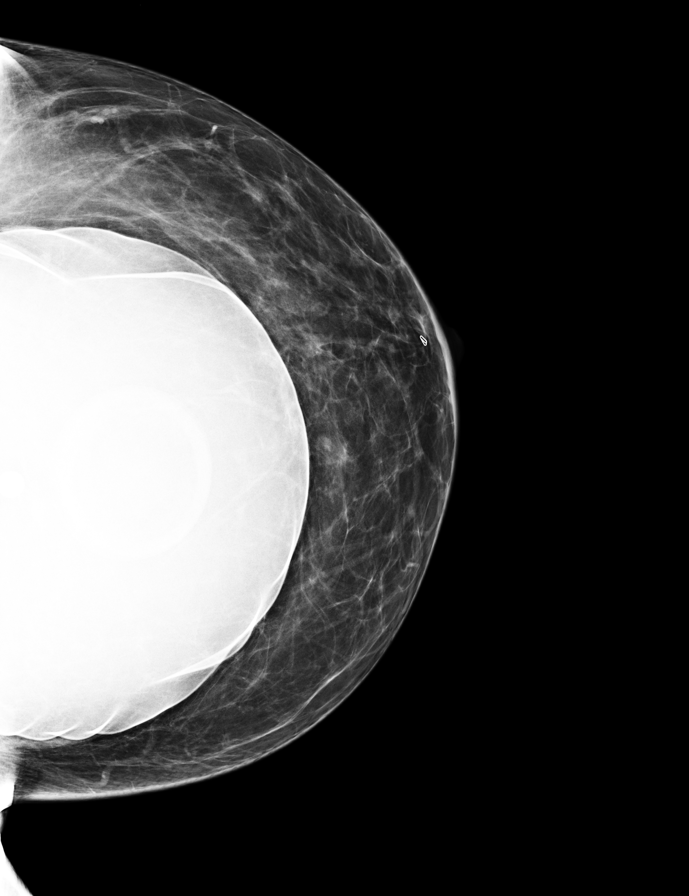

[R CC (1 of 3)]
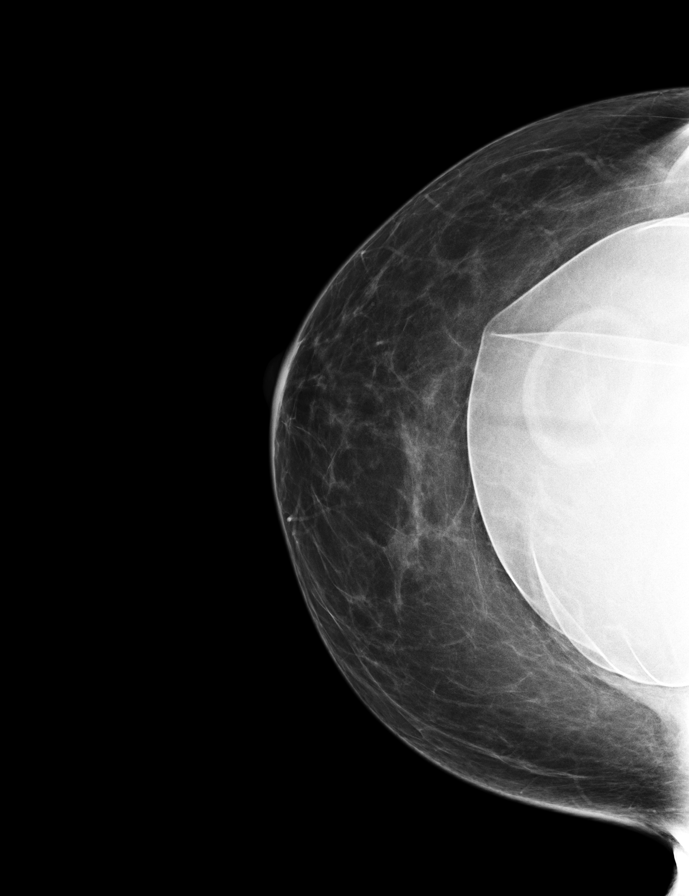

[L MLO]
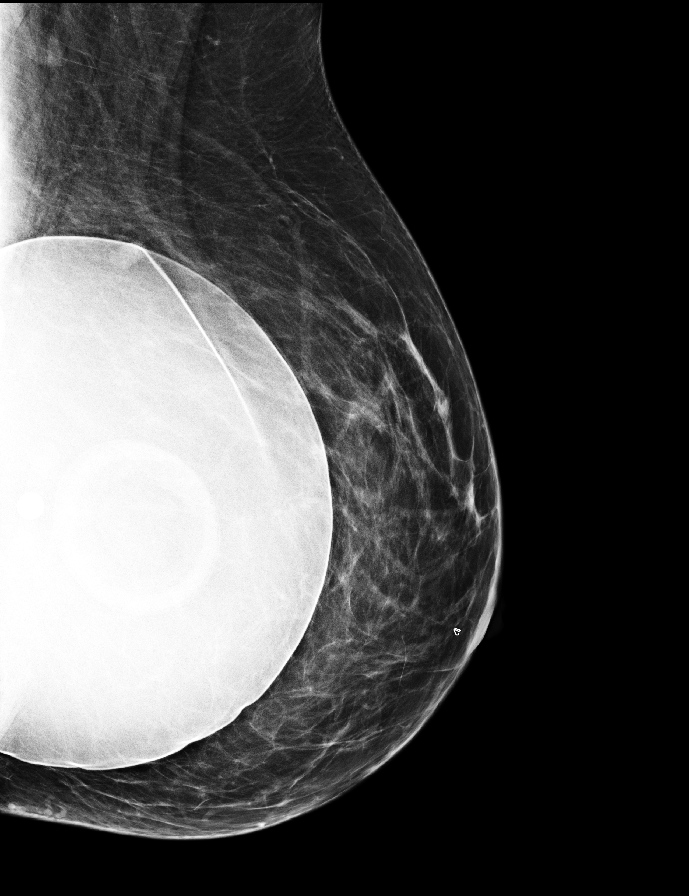

[L MLO synth-2D]
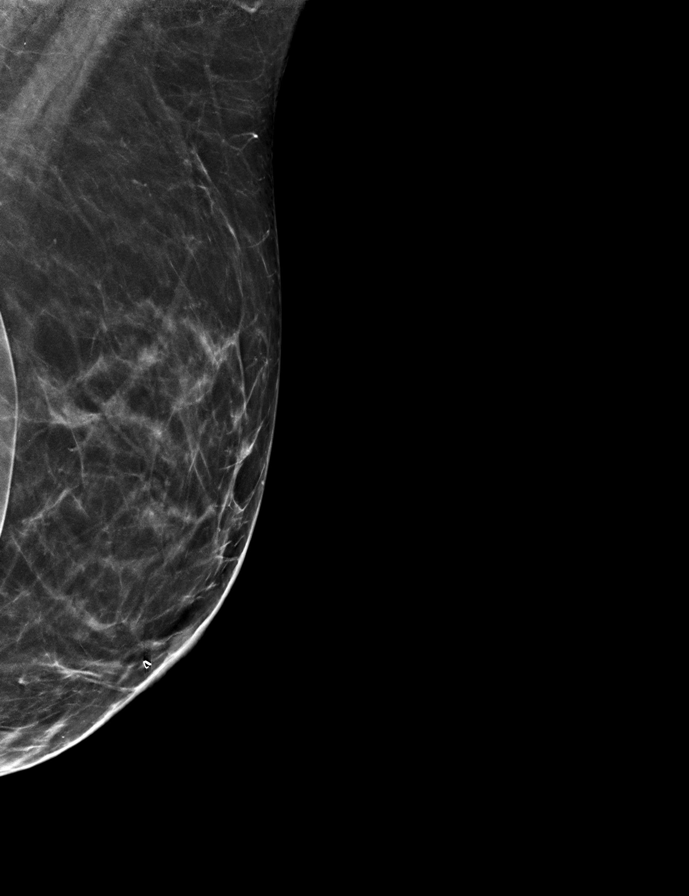

[R CC (2 of 3)]
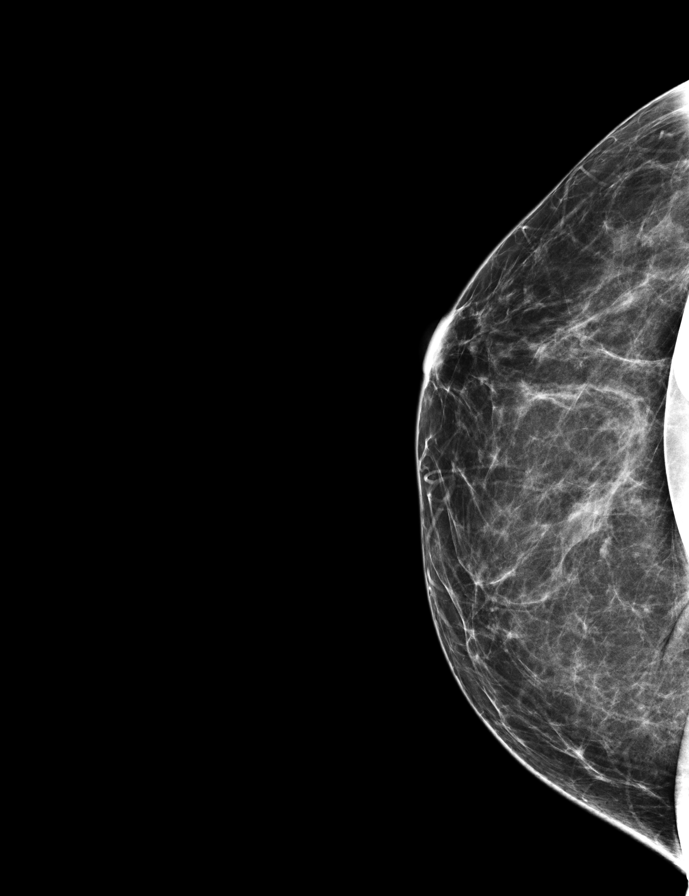

[L CC (2 of 2)]
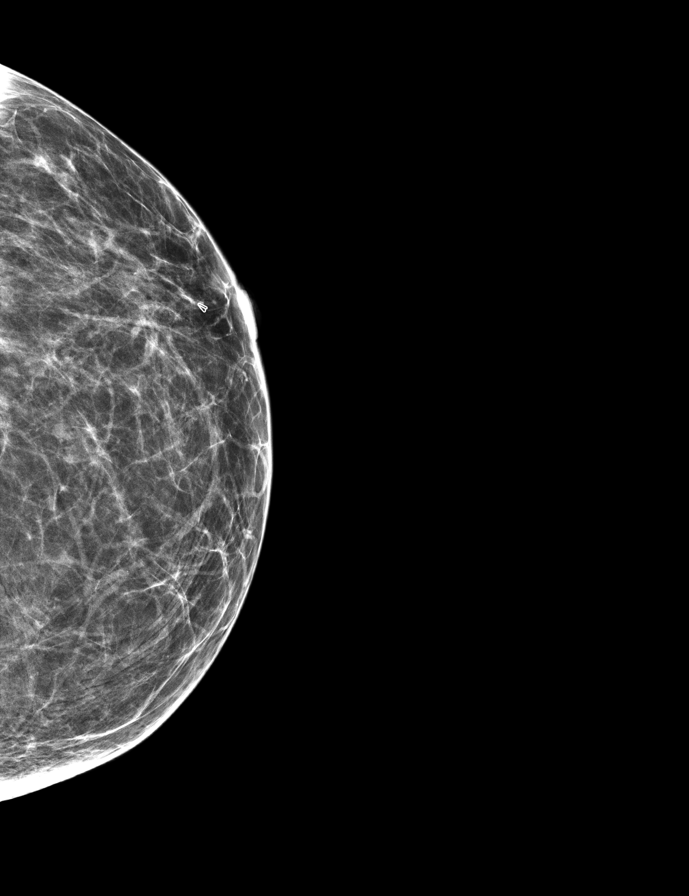

[R CC (3 of 3)]
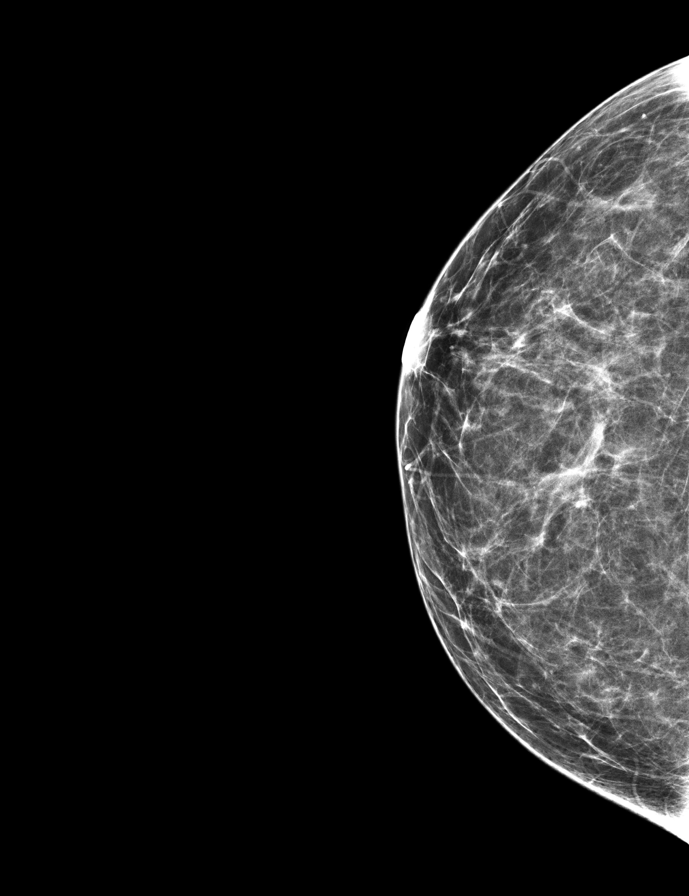

[R MLO (2 of 2)]
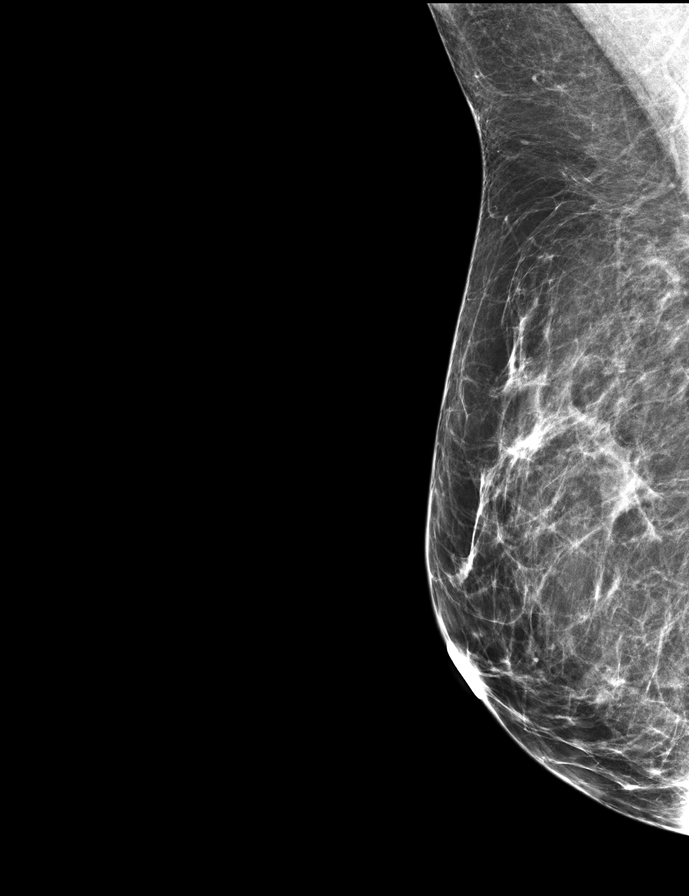

[9 of 39 positions shown; findings below may reference images not displayed]

ACR Breast Density Category b: There are scattered areas of
fibroglandular density.
FINDINGS: The patient has retropectoral implants. There are no findings
suspicious for malignancy.
IMPRESSION: No mammographic evidence of malignancy. A result letter of this
screening mammogram will be mailed directly to the patient.

RECOMMENDATION:
Screening mammogram in one year. (Code:[WS])

BI-RADS CATEGORY  1:  Negative.

## 2022-01-11 ENCOUNTER — Other Ambulatory Visit: Payer: Self-pay | Admitting: Obstetrics and Gynecology

## 2022-01-11 DIAGNOSIS — Z1231 Encounter for screening mammogram for malignant neoplasm of breast: Secondary | ICD-10-CM

## 2022-02-23 ENCOUNTER — Ambulatory Visit
Admission: RE | Admit: 2022-02-23 | Discharge: 2022-02-23 | Disposition: A | Discharge status: Home / Self Care | Payer: 59 | Source: Ambulatory Visit | Attending: Obstetrics and Gynecology | Admitting: Obstetrics and Gynecology

## 2022-02-23 DIAGNOSIS — Z1231 Encounter for screening mammogram for malignant neoplasm of breast: Secondary | ICD-10-CM | POA: Diagnosis present

## 2022-02-23 IMAGING — MG DIGITAL SCREENING BREAST BILAT IMPLANT W/ TOMO W/ CAD
9 of 14 series · 9 of 34 positions shown · non-contrast
Comparison: Previous exam(s).

CLINICAL DATA: Screening.

EXAM:
DIGITAL SCREENING BILATERAL MAMMOGRAM WITH IMPLANTS, CAD AND
TOMOSYNTHESIS
TECHNIQUE: Bilateral screening digital craniocaudal and mediolateral oblique
mammograms were obtained. Bilateral screening digital breast
tomosynthesis was performed. The images were evaluated with
computer-aided detection. Standard and/or implant displaced views
were performed.

[L CC]
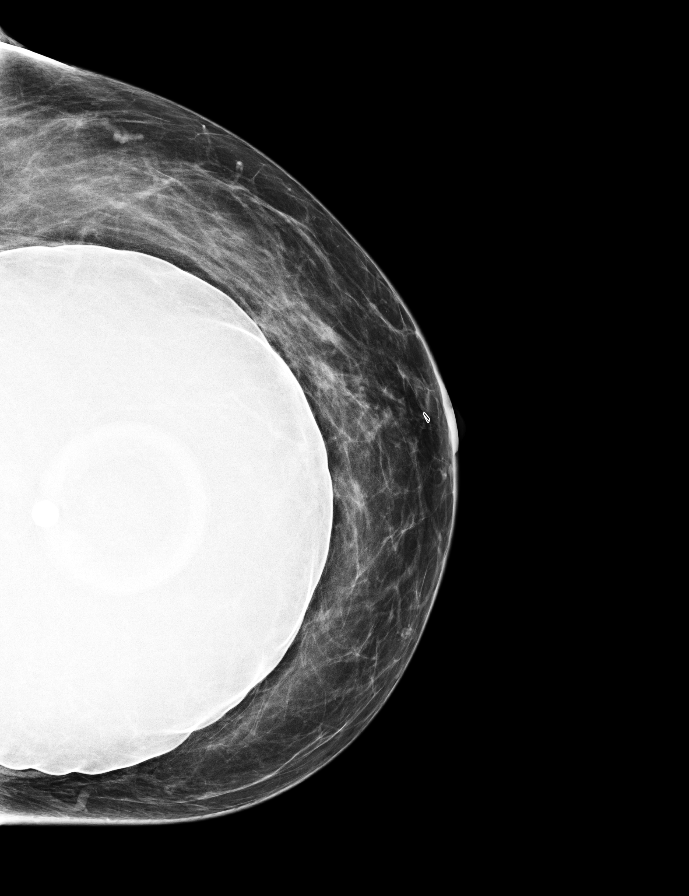

[R MLO]
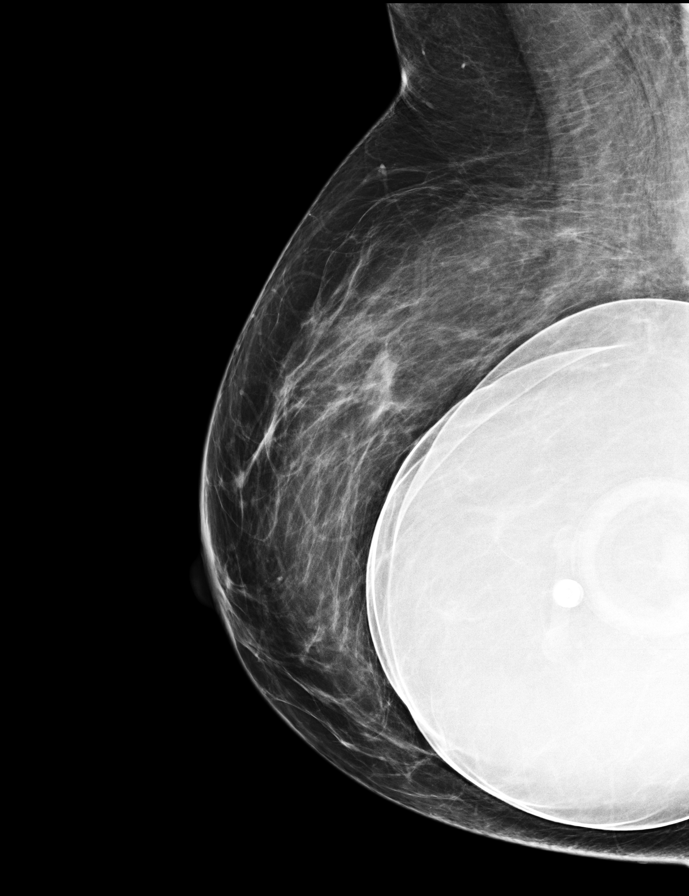

[L MLO]
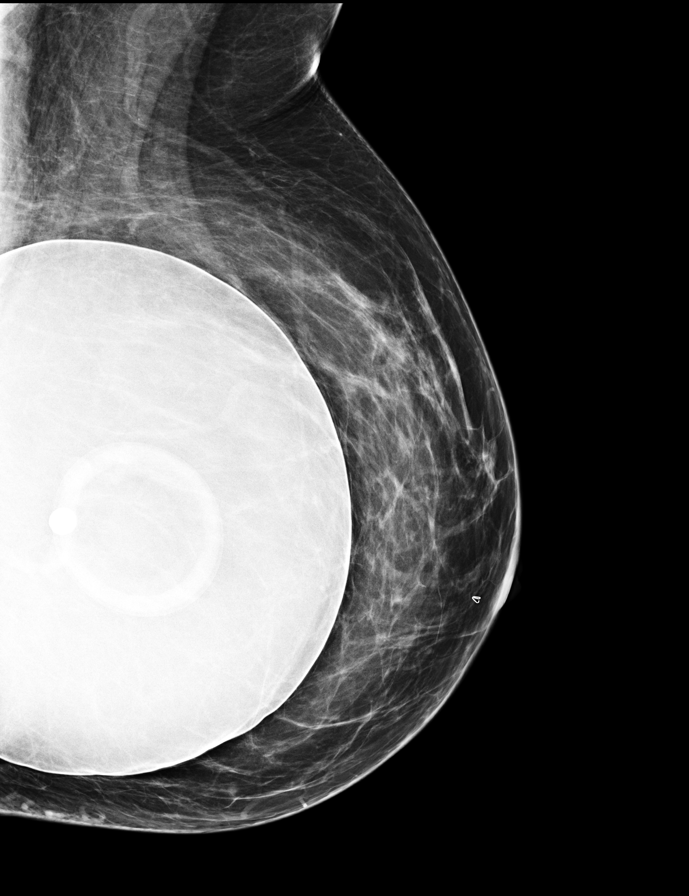

[R CC]
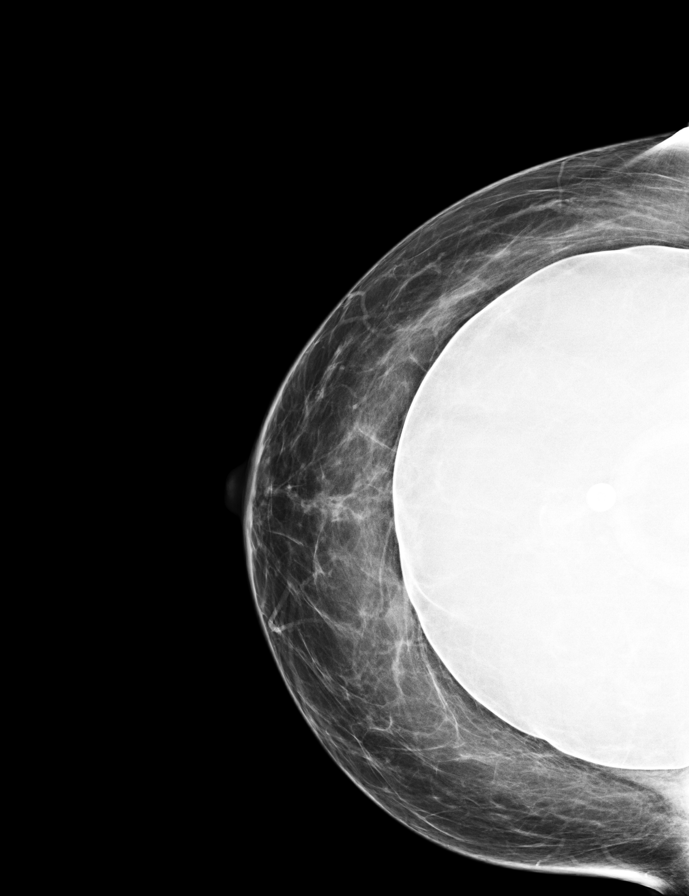

[R CC synth-2D]
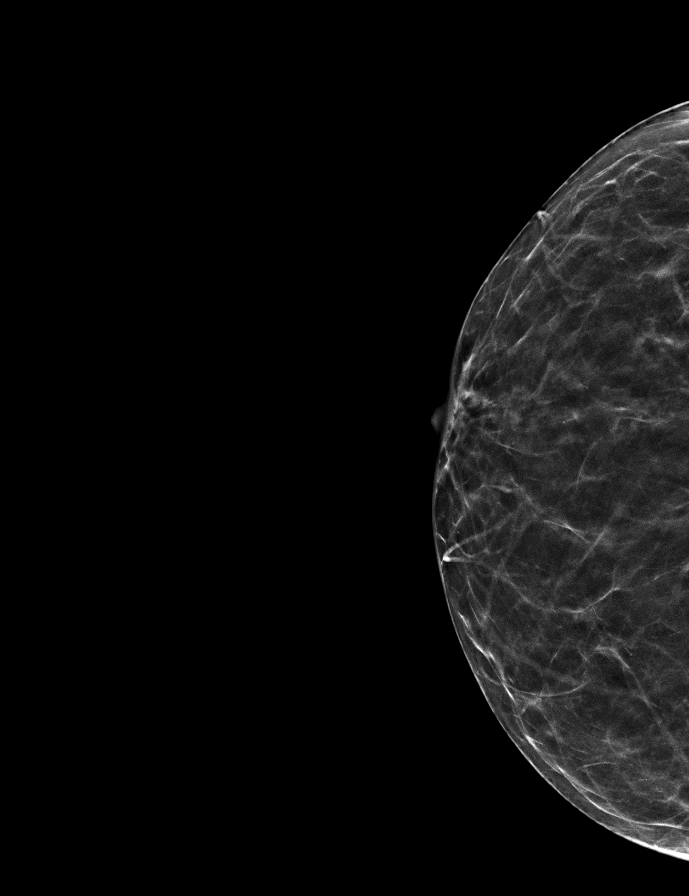

[L MLO synth-2D]
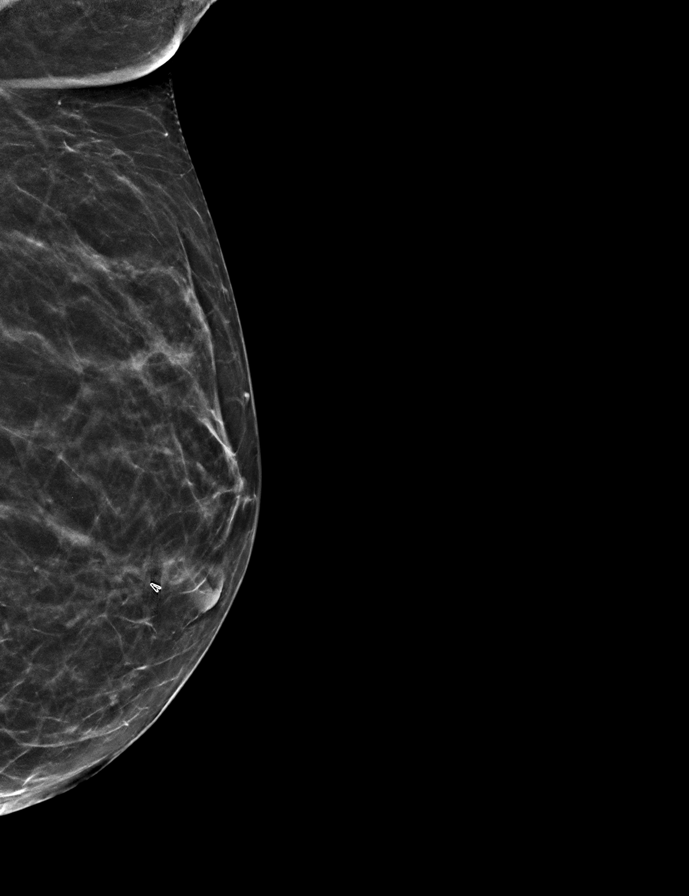

[L CC synth-2D]
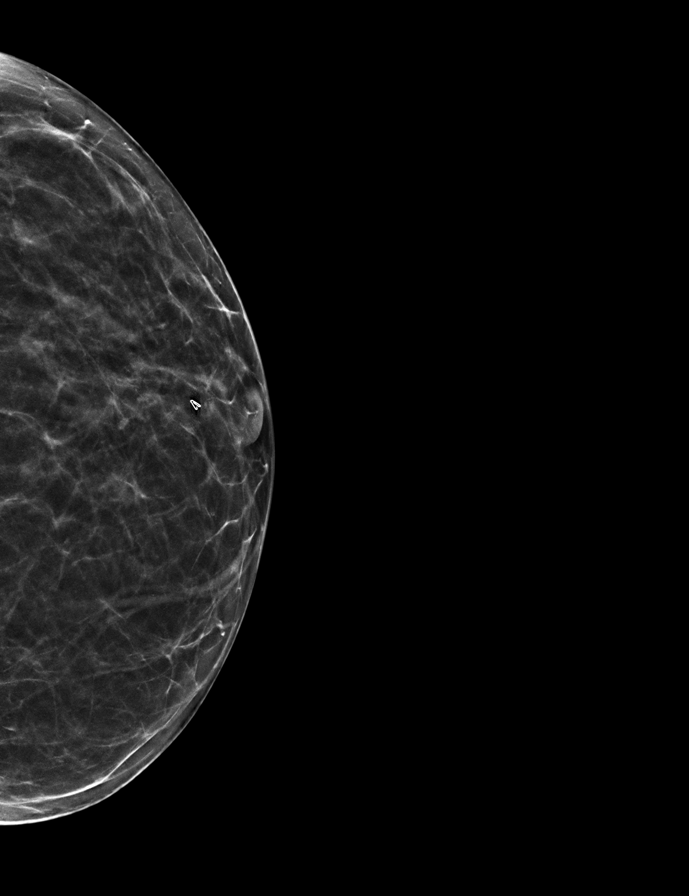

[R MLO synth-2D (1 of 2)]
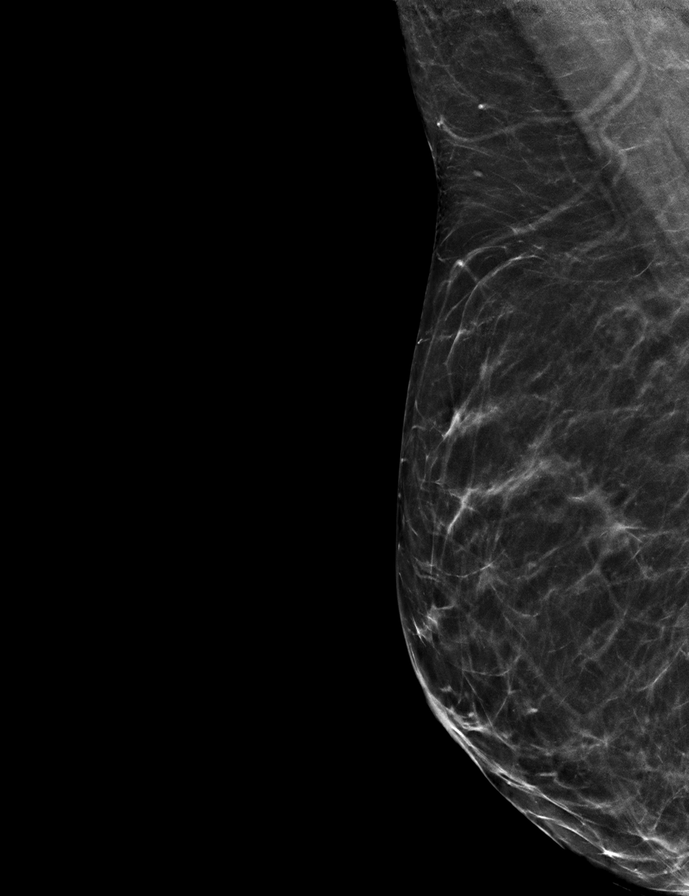

[R MLO synth-2D (2 of 2)]
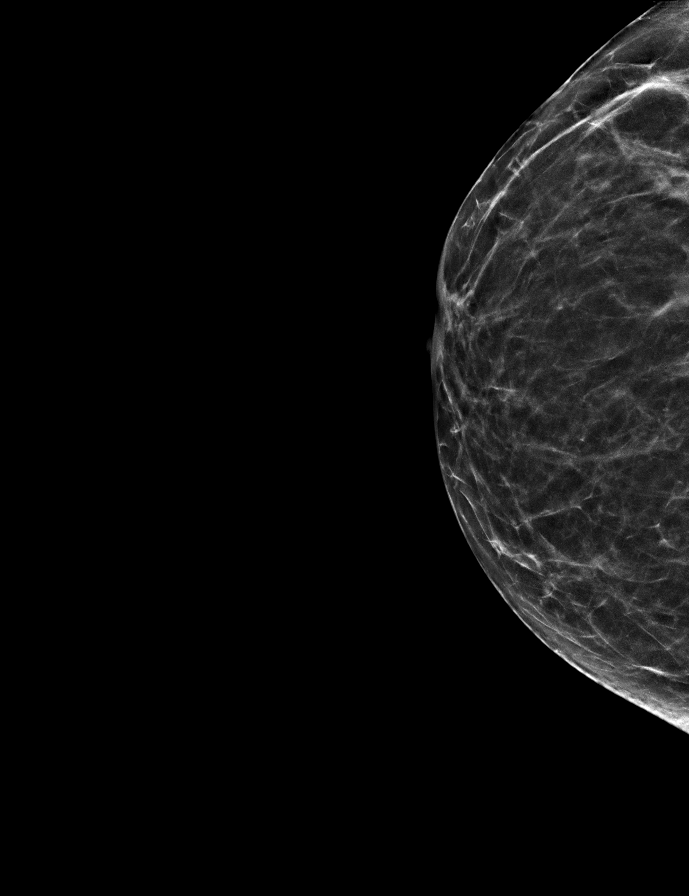

[9 of 34 positions shown; findings below may reference images not displayed]

ACR Breast Density Category b: There are scattered areas of
fibroglandular density.
FINDINGS: The patient has bilateral retropectoral saline implants. There are
no findings suspicious for malignancy.
IMPRESSION: No mammographic evidence of malignancy. A result letter of this
screening mammogram will be mailed directly to the patient.

RECOMMENDATION:
Screening mammogram in one year. (Code:[VO])

BI-RADS CATEGORY  1:  Negative.

## 2022-03-18 ENCOUNTER — Other Ambulatory Visit: Payer: Self-pay | Admitting: Physical Medicine & Rehabilitation

## 2022-03-18 ENCOUNTER — Other Ambulatory Visit (HOSPITAL_COMMUNITY): Payer: Self-pay | Admitting: Physical Medicine & Rehabilitation

## 2022-03-18 DIAGNOSIS — M542 Cervicalgia: Secondary | ICD-10-CM

## 2022-03-18 DIAGNOSIS — M546 Pain in thoracic spine: Secondary | ICD-10-CM

## 2022-03-27 ENCOUNTER — Ambulatory Visit
Admission: RE | Admit: 2022-03-27 | Discharge: 2022-03-27 | Disposition: A | Discharge status: Home / Self Care | Payer: 59 | Source: Ambulatory Visit | Attending: Physical Medicine & Rehabilitation | Admitting: Physical Medicine & Rehabilitation

## 2022-03-27 DIAGNOSIS — M542 Cervicalgia: Secondary | ICD-10-CM | POA: Diagnosis present

## 2022-03-27 DIAGNOSIS — M546 Pain in thoracic spine: Secondary | ICD-10-CM

## 2022-03-27 IMAGING — MR MR THORACIC SPINE W/O CM
6 series · 30 of 48 positions shown · non-contrast
Comparison: None Available.

CLINICAL DATA: Initial evaluation for right-sided flank and mid
back pain for 1 month, chronic neck pain.

EXAM:
MRI THORACIC SPINE WITHOUT CONTRAST
TECHNIQUE: Multiplanar, multisequence MR imaging of the thoracic spine was
performed. No intravenous contrast was administered.

[Series 18: T1 · sagittal · 6.0mm · 1.88mm/px · 2 of 9 slices shown (1 of 2)]
[im 1/9]
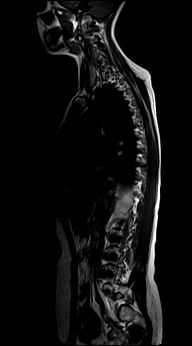
[im 9/9]
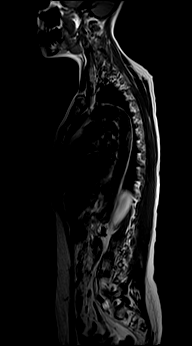

[Series 19: T2 · sagittal · 3.0mm · 1.06mm/px · 6 of 17 slices shown (1 of 2)]
[im 1/17]
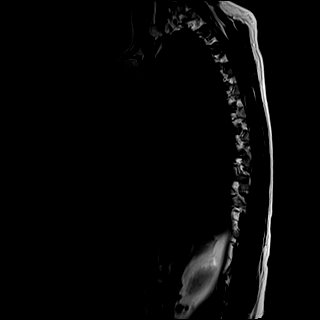
[im 4/17]
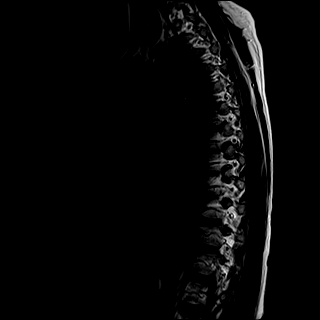
[im 7/17]
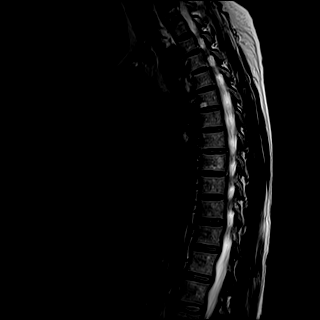
[im 10/17]
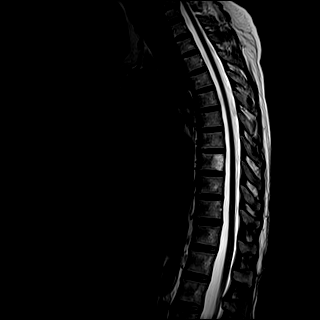
[im 13/17]
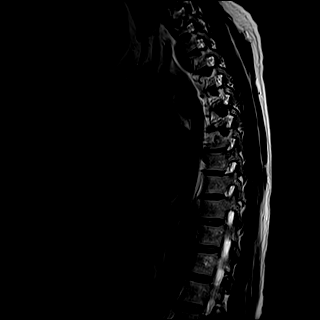
[im 17/17]
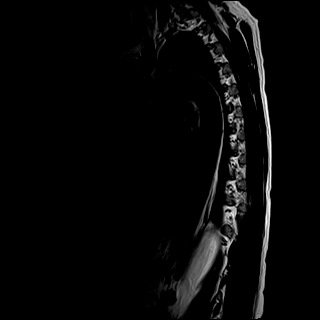

[Series 20: T1 · sagittal · 3.0mm · 1.06mm/px · 6 of 17 slices shown (2 of 2)]
[im 1/17]
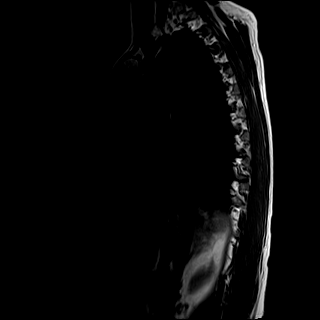
[im 4/17]
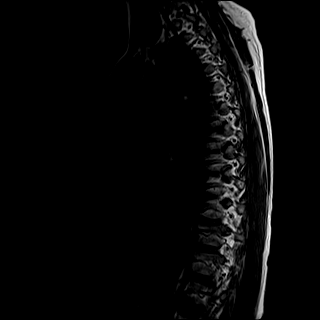
[im 7/17]
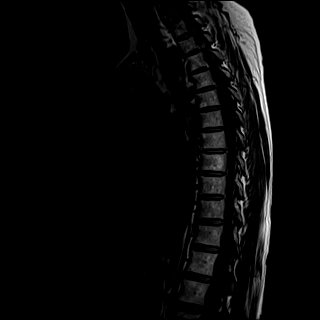
[im 10/17]
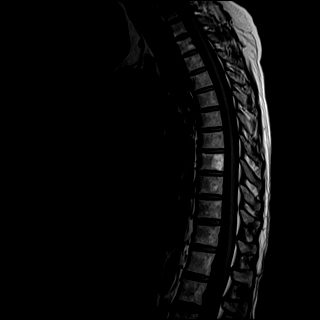
[im 13/17]
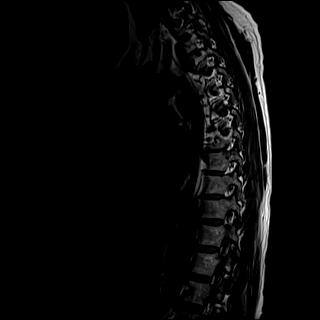
[im 17/17]
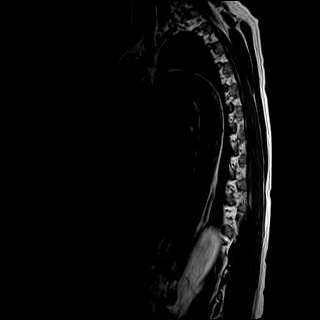

[Series 21: STIR · sagittal · 3.0mm · 0.53mm/px · 6 of 17 slices shown]
[im 1/17]
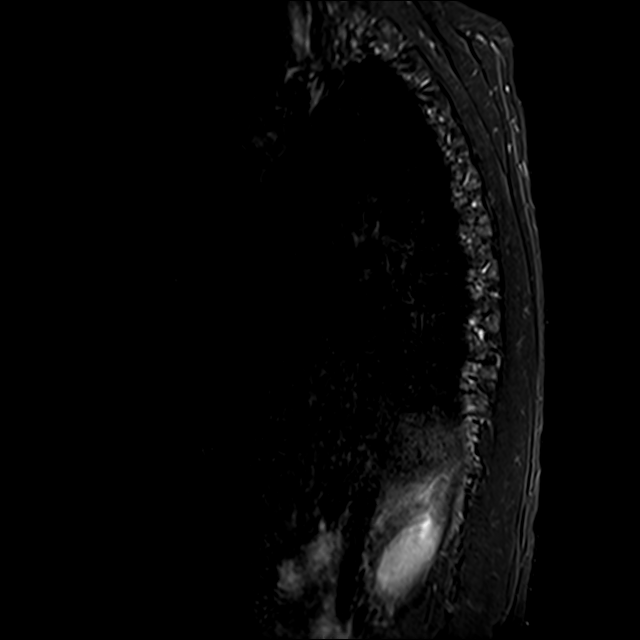
[im 4/17]
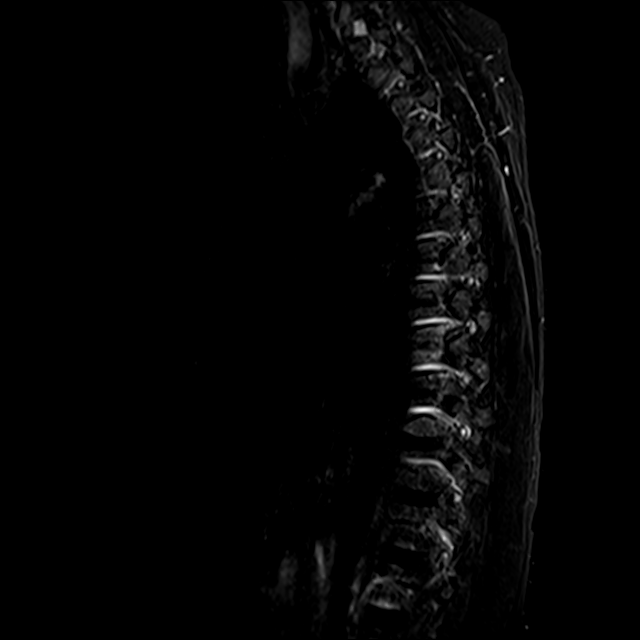
[im 7/17]
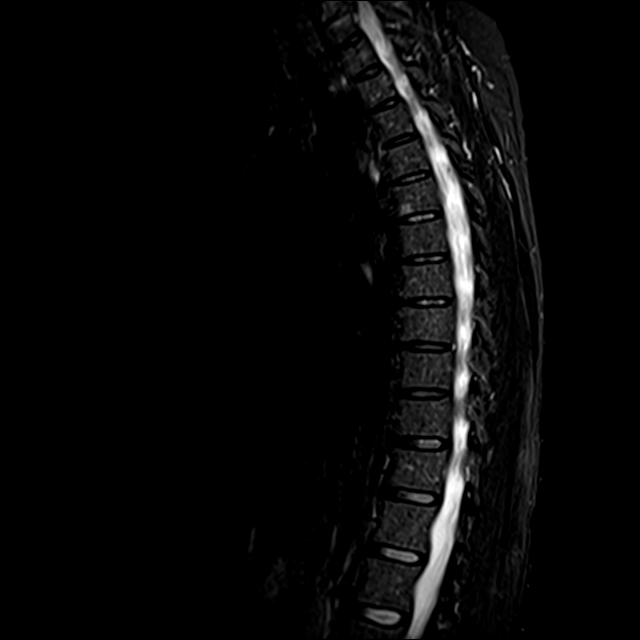
[im 10/17]
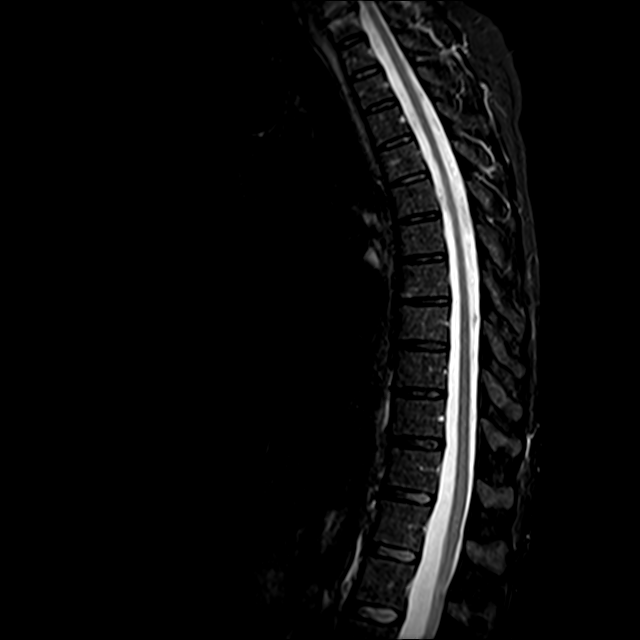
[im 13/17]
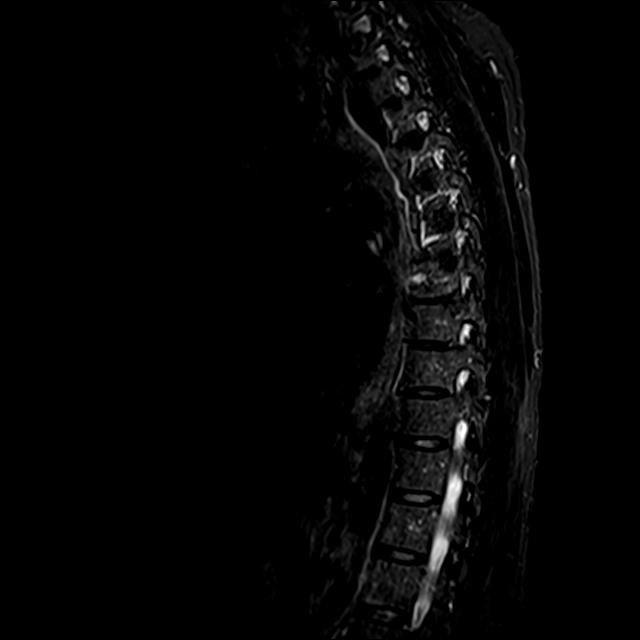
[im 17/17]
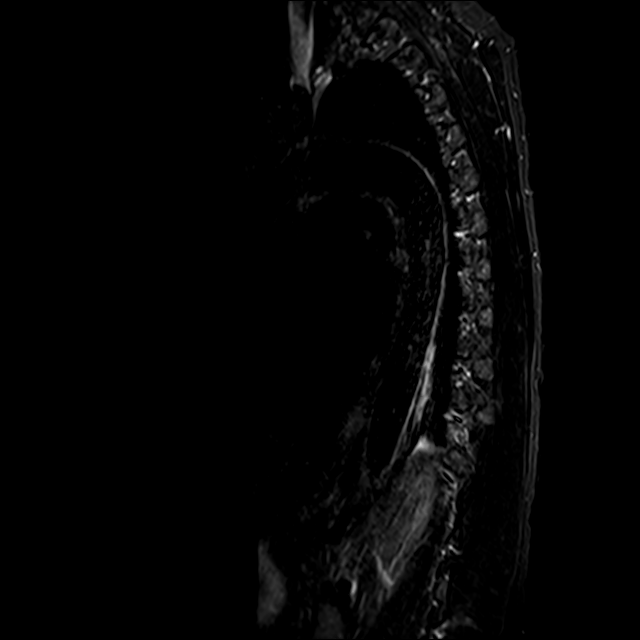

[Series 22: T2 · axial · 4.0mm · 0.74mm/px · z∈[-268,-40]mm · 8 of 39 slices shown (2 of 2)]
[im 1/39]
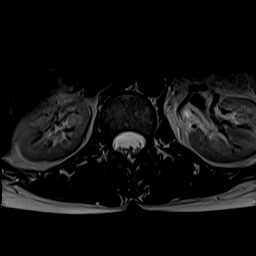
[im 6/39]
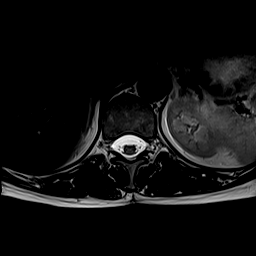
[im 12/39]
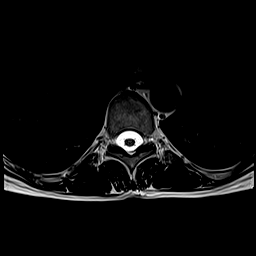
[im 18/39]
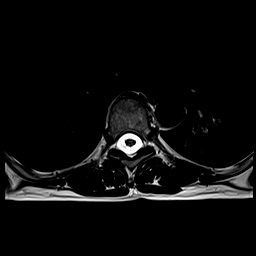
[im 21/39]
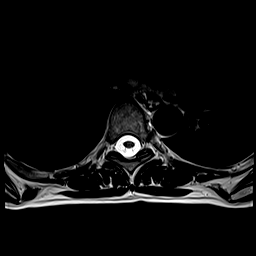
[im 27/39]
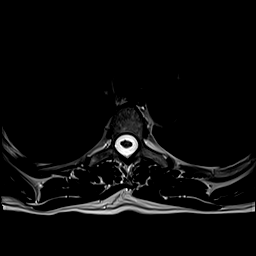
[im 33/39]
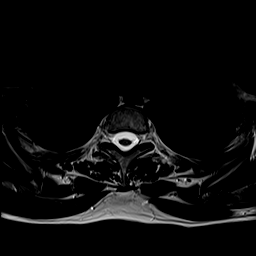
[im 39/39]
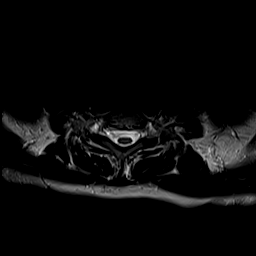

[Series 23: GRE · axial · 4.0mm · 0.37mm/px · z∈[-271,-229]mm · 2 of 39 slices shown]
[im 1/39]
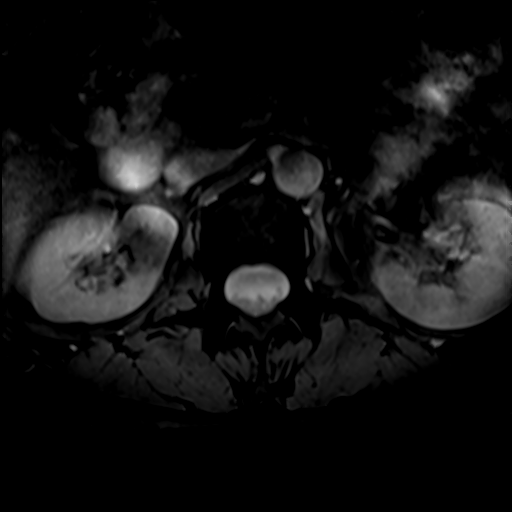
[im 6/39]
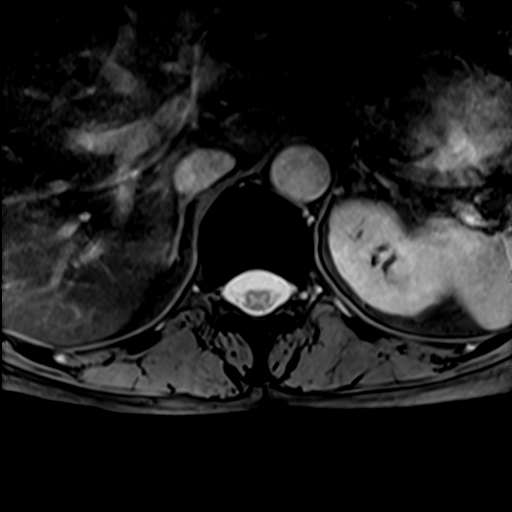

[30 of 48 positions shown; findings below may reference images not displayed]

FINDINGS: Alignment: Physiologic with preservation of the normal thoracic
kyphosis. No listhesis.

Vertebrae: Vertebral body height maintained without acute or chronic
fracture. Bone marrow signal intensity mildly heterogeneous with
multiple scattered benign hemangiomata, largest of which measures
1.6 cm within the T8 vertebral body. No worrisome osseous lesions.
Minimal discogenic reactive endplate change present about the T5-6
and T7-8 interspaces anteriorly. No other abnormal marrow edema.

Cord:  Normal signal and morphology.

Paraspinal and other soft tissues: Unremarkable.

Disc levels:

T1-2: Tiny central disc protrusion with annular fissure indents the
ventral thecal sac (series 23, image 5). No stenosis.

Otherwise, no other significant disc pathology seen elsewhere within
the thoracic spine. No significant facet disease. No canal or neural
foraminal stenosis or evidence for neural impingement.
IMPRESSION: 1. Tiny central disc protrusion at T1-2 without stenosis.
2. Otherwise unremarkable and normal MRI of the thoracic spine. No
other findings to explain patient's symptoms identified.

## 2022-03-27 IMAGING — MR MR CERVICAL SPINE W/O CM
5 series · 37 of 48 positions shown · non-contrast
Comparison: None available.

CLINICAL DATA: Initial evaluation for right-sided flank and mid
back pain for 1 month, chronic neck pain.

EXAM:
MRI CERVICAL SPINE WITHOUT CONTRAST
TECHNIQUE: Multiplanar, multisequence MR imaging of the cervical spine was
performed. No intravenous contrast was administered.

[Series 25: FLAIR · sagittal · 3.0mm · 0.78mm/px · 7 of 13 slices shown]
[im 1/13]
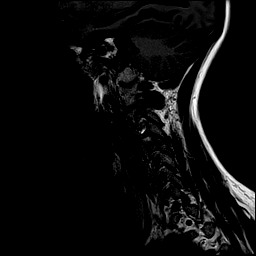
[im 3/13]
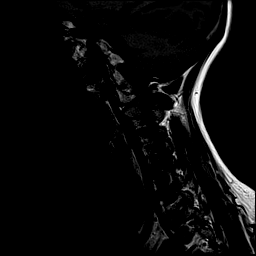
[im 5/13]
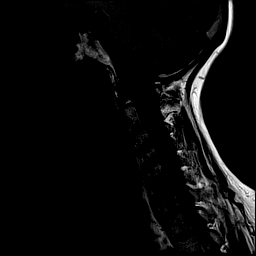
[im 7/13]
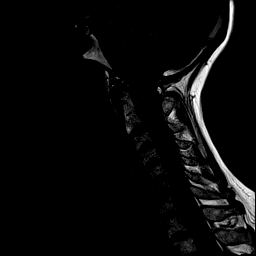
[im 9/13]
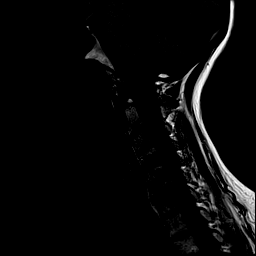
[im 11/13]
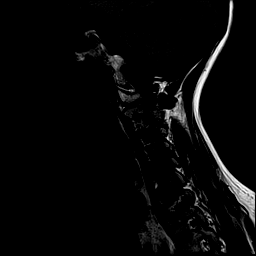
[im 13/13]
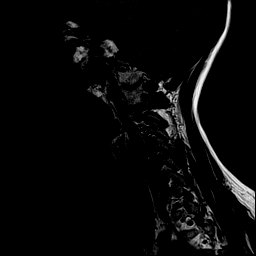

[Series 26: T2 · sagittal · 3.0mm · 0.62mm/px · 7 of 13 slices shown (1 of 2)]
[im 1/13]
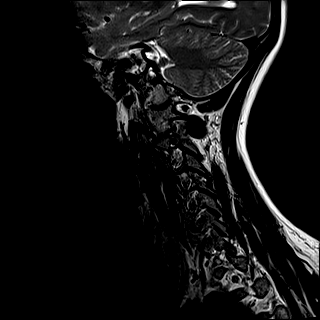
[im 3/13]
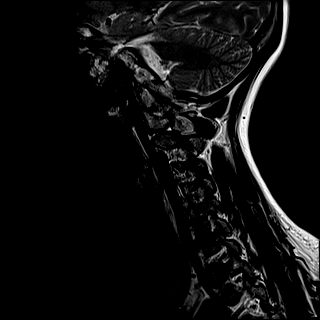
[im 5/13]
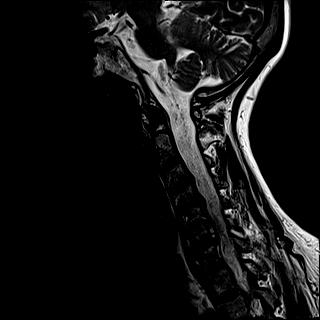
[im 7/13]
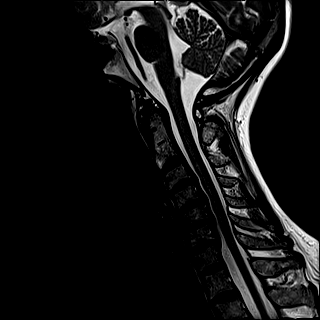
[im 9/13]
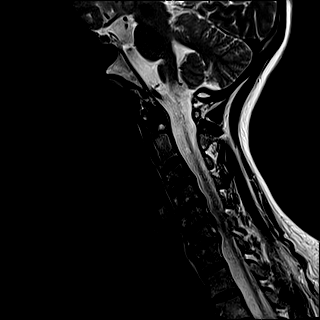
[im 11/13]
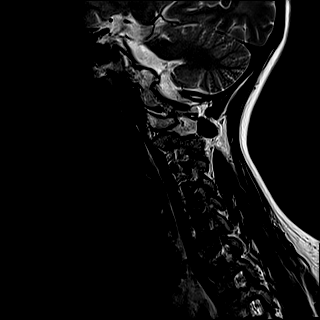
[im 13/13]
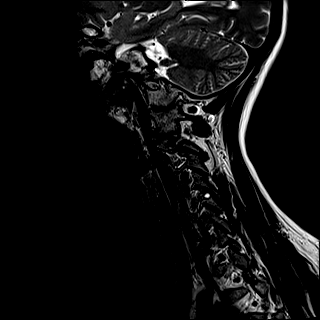

[Series 27: STIR · sagittal · 3.0mm · 0.62mm/px · 6 of 13 slices shown]
[im 1/13]
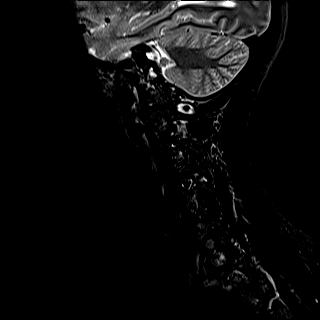
[im 3/13]
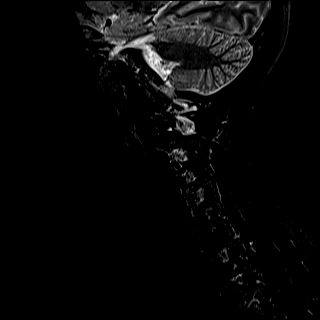
[im 5/13]
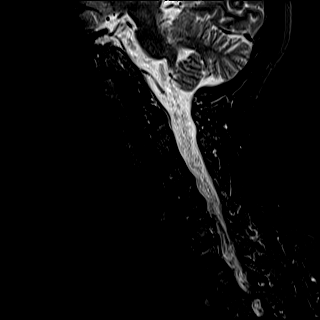
[im 8/13]
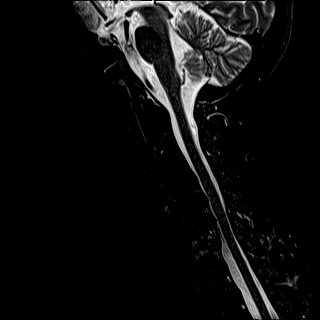
[im 10/13]
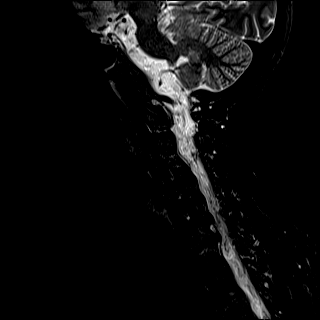
[im 13/13]
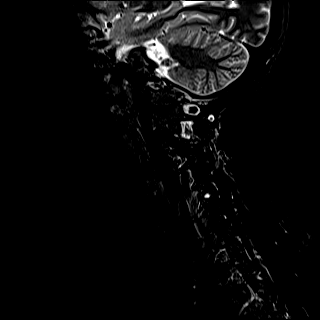

[Series 28: T2 · axial · 3.0mm · 0.70mm/px · z∈[-47,+43]mm · 9 of 29 slices shown (2 of 2)]
[im 1/29]
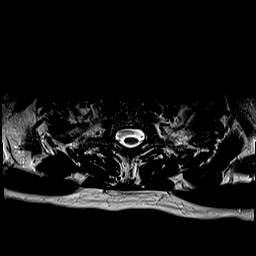
[im 3/29]
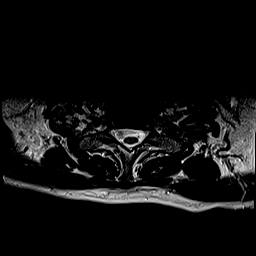
[im 5/29]
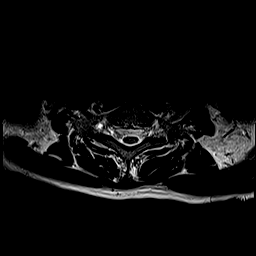
[im 9/29]
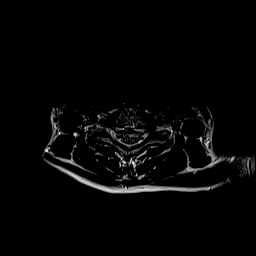
[im 13/29]
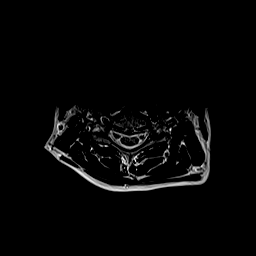
[im 16/29]
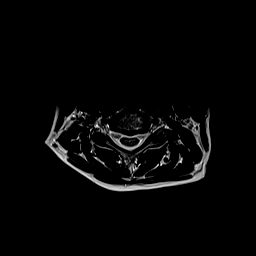
[im 20/29]
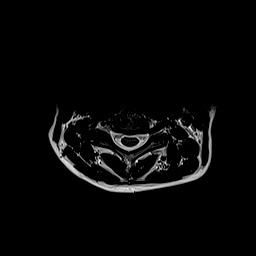
[im 24/29]
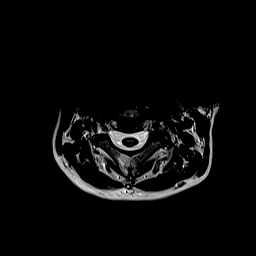
[im 29/29]
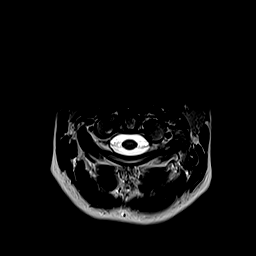

[Series 29: csp ax mpgr · axial · 3.0mm · 0.35mm/px · z∈[-47,+43]mm · 8 of 29 slices shown]
[im 1/29]
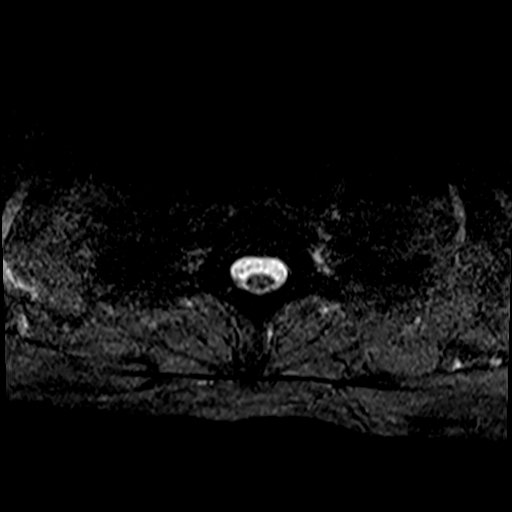
[im 5/29]
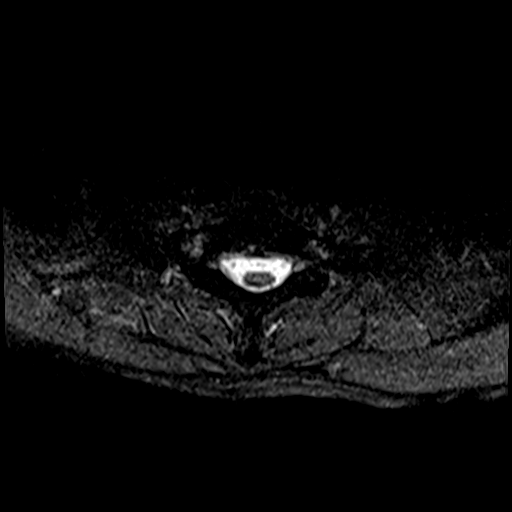
[im 9/29]
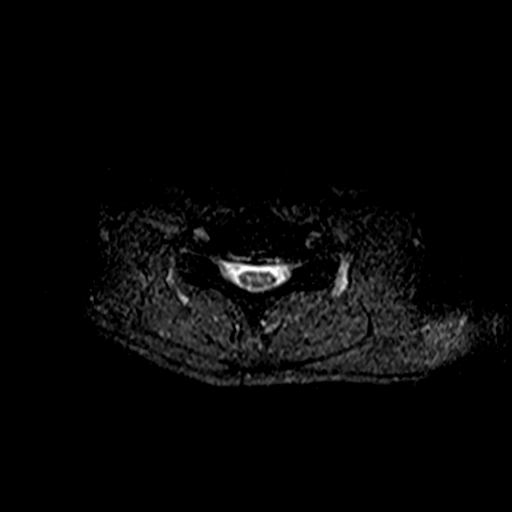
[im 13/29]
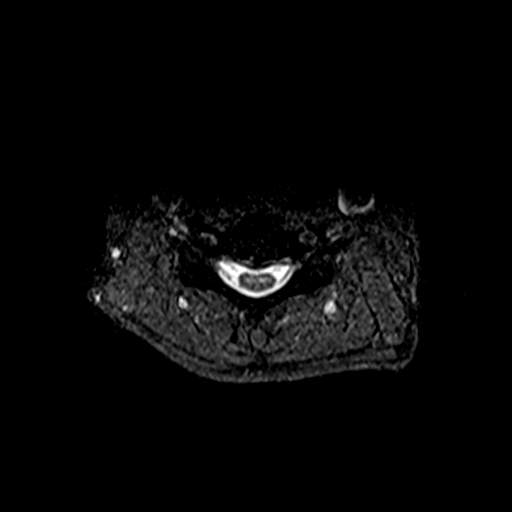
[im 16/29]
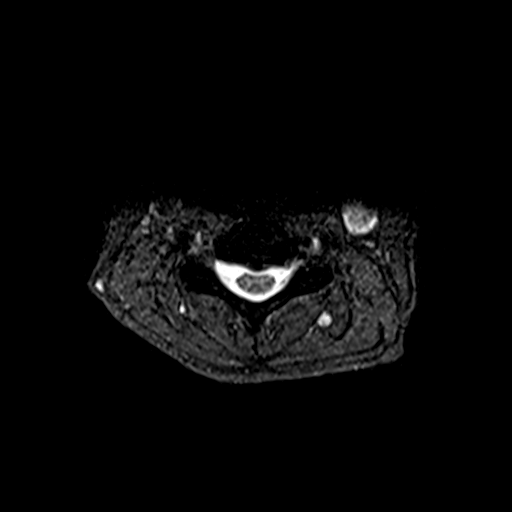
[im 20/29]
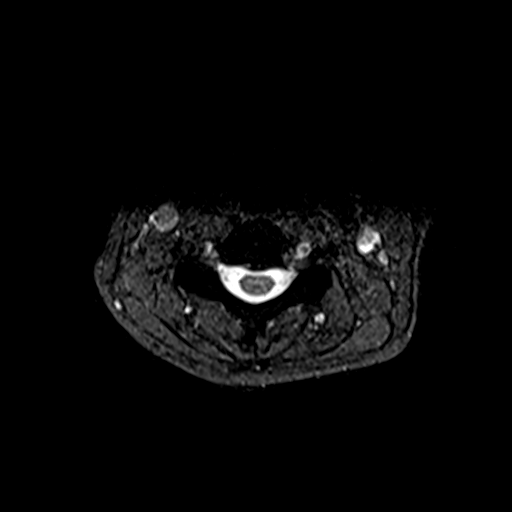
[im 24/29]
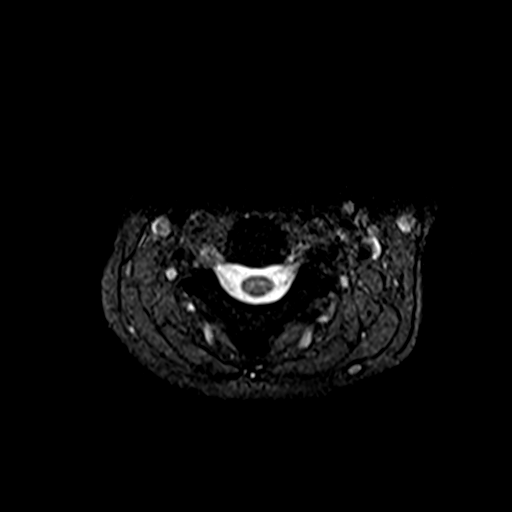
[im 29/29]
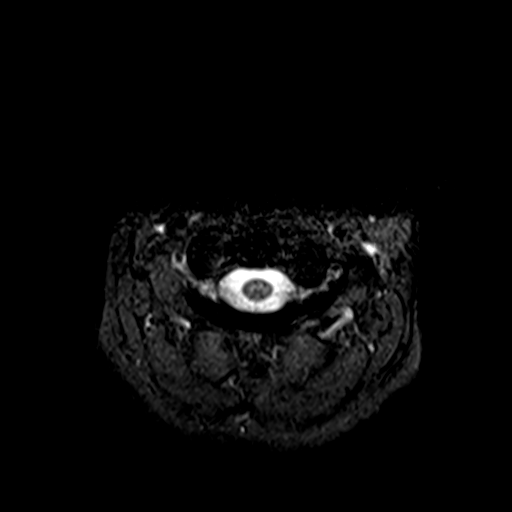

[37 of 48 positions shown; findings below may reference images not displayed]

FINDINGS: Alignment: Straightening with mild reversal of the normal cervical
lordosis. No significant listhesis.

Vertebrae: Vertebral body height maintained without acute or chronic
fracture. Bone marrow signal intensity within normal limits. No
discrete or worrisome osseous lesions. Discogenic reactive endplate
change present about the C6-7 interspace and to a lesser extent the
C4-5 and C5-6 interspaces. No other abnormal marrow edema.

Cord: Normal signal and morphology.

Posterior Fossa, vertebral arteries, paraspinal tissues:
Unremarkable.

Disc levels:

C2-C3: Negative interspace. Mild left greater than right facet
hypertrophy. No canal or foraminal stenosis.

C3-C4: Mild disc bulge with endplate and uncovertebral spurring.
Superimposed small central disc protrusion indents the ventral
thecal sac. No significant spinal stenosis or cord deformity.
Foramina remain patent.

C4-C5: Degenerative intervertebral disc space narrowing. Central to
left paracentral disc protrusion indents the ventral thecal sac
(series 29, image 15). Minimal flattening of the ventral cord
without cord signal changes or significant spinal stenosis.
Bilateral uncovertebral spurring with resultant mild left C5
foraminal stenosis. Right neural foramen remains patent.

C5-C6: Degenerative intervertebral disc space narrowing. Broad-based
posterior disc osteophyte indents and partially effaces the ventral
thecal sac. Mild cord flattening no cord signal changes. Mild spinal
stenosis. Moderate left with mild right C6 foraminal narrowing.

C6-C7: Degenerative intervertebral disc space narrowing with
circumferential disc osteophyte complex. Flattening of the ventral
thecal sac without significant spinal stenosis. Moderate bilateral
C7 foraminal narrowing.

C7-T1:  Unremarkable.
IMPRESSION: 1. Multilevel cervical spondylosis with resultant mild spinal
stenosis at C5-6. No other significant spinal stenosis or frank cord
impingement.
2. Multifactorial degenerative changes with resultant multilevel
foraminal narrowing as above. Notable findings include mild left C5
foraminal stenosis, moderate left with mild right C6 foraminal
narrowing, with moderate bilateral C7 foraminal stenosis.

## 2023-01-15 ENCOUNTER — Other Ambulatory Visit: Payer: Self-pay | Admitting: Physical Medicine & Rehabilitation

## 2023-01-15 DIAGNOSIS — M542 Cervicalgia: Secondary | ICD-10-CM

## 2023-01-31 ENCOUNTER — Other Ambulatory Visit: Payer: Self-pay | Admitting: Family Medicine

## 2023-01-31 DIAGNOSIS — Z1231 Encounter for screening mammogram for malignant neoplasm of breast: Secondary | ICD-10-CM

## 2023-02-07 ENCOUNTER — Ambulatory Visit
Admission: RE | Admit: 2023-02-07 | Discharge: 2023-02-07 | Disposition: A | Discharge status: Home / Self Care | Payer: 59 | Source: Ambulatory Visit | Attending: Family Medicine | Admitting: Family Medicine

## 2023-02-07 DIAGNOSIS — Z1231 Encounter for screening mammogram for malignant neoplasm of breast: Secondary | ICD-10-CM

## 2023-03-03 ENCOUNTER — Ambulatory Visit
Admission: RE | Admit: 2023-03-03 | Discharge: 2023-03-03 | Disposition: A | Discharge status: Home / Self Care | Payer: 59 | Source: Ambulatory Visit | Attending: Family Medicine | Admitting: Family Medicine

## 2023-03-03 DIAGNOSIS — Z1231 Encounter for screening mammogram for malignant neoplasm of breast: Secondary | ICD-10-CM | POA: Insufficient documentation

## 2023-05-25 ENCOUNTER — Encounter: Payer: Self-pay | Admitting: Physical Medicine & Rehabilitation

## 2023-05-25 ENCOUNTER — Other Ambulatory Visit: Payer: Self-pay | Admitting: Physical Medicine & Rehabilitation

## 2023-05-25 DIAGNOSIS — M5412 Radiculopathy, cervical region: Secondary | ICD-10-CM

## 2023-05-31 NOTE — Discharge Instructions (Signed)

## 2023-06-01 ENCOUNTER — Ambulatory Visit
Admission: RE | Admit: 2023-06-01 | Discharge: 2023-06-01 | Disposition: A | Discharge status: Home / Self Care | Payer: 59 | Source: Ambulatory Visit | Attending: Physical Medicine & Rehabilitation | Admitting: Physical Medicine & Rehabilitation

## 2023-06-01 DIAGNOSIS — M542 Cervicalgia: Secondary | ICD-10-CM

## 2023-06-01 MED ORDER — IOPAMIDOL (ISOVUE-M 300) INJECTION 61%
1.0000 mL | Freq: Once | INTRAMUSCULAR | Status: AC | PRN
  Administered 2023-06-01: 1 mL via EPIDURAL

## 2023-06-01 MED ORDER — TRIAMCINOLONE ACETONIDE 40 MG/ML IJ SUSP (RADIOLOGY)
60.0000 mg | Freq: Once | INTRAMUSCULAR | Status: AC
  Administered 2023-06-01: 60 mg via EPIDURAL

## 2024-03-07 ENCOUNTER — Other Ambulatory Visit: Payer: Self-pay | Admitting: Family Medicine

## 2024-03-07 DIAGNOSIS — Z1231 Encounter for screening mammogram for malignant neoplasm of breast: Secondary | ICD-10-CM

## 2024-03-22 ENCOUNTER — Ambulatory Visit
Admission: RE | Admit: 2024-03-22 | Discharge: 2024-03-22 | Disposition: A | Discharge status: Home / Self Care | Source: Ambulatory Visit | Attending: Family Medicine | Admitting: Family Medicine

## 2024-03-22 DIAGNOSIS — Z1231 Encounter for screening mammogram for malignant neoplasm of breast: Secondary | ICD-10-CM | POA: Diagnosis present

## 2024-07-24 ENCOUNTER — Other Ambulatory Visit: Payer: Self-pay | Admitting: Physical Medicine & Rehabilitation

## 2024-07-24 ENCOUNTER — Encounter: Payer: Self-pay | Admitting: Physical Medicine & Rehabilitation

## 2024-07-24 DIAGNOSIS — M5442 Lumbago with sciatica, left side: Secondary | ICD-10-CM

## 2024-07-24 DIAGNOSIS — M5441 Lumbago with sciatica, right side: Secondary | ICD-10-CM

## 2024-07-26 ENCOUNTER — Other Ambulatory Visit: Payer: Self-pay

## 2024-07-26 DIAGNOSIS — R1012 Left upper quadrant pain: Secondary | ICD-10-CM
